# Patient Record
Sex: Male | Born: 1965 | Race: Black or African American | Hispanic: No | Marital: Single | Smoking: Current every day smoker
Health system: Southern US, Community
[De-identification: ages and names within clinical notes are randomized; demographics above are authoritative.]

## PROBLEM LIST (undated history)

## (undated) DIAGNOSIS — B1920 Unspecified viral hepatitis C without hepatic coma: Secondary | ICD-10-CM

## (undated) HISTORY — PX: FRACTURE SURGERY: SHX138

---

## 2008-07-13 ENCOUNTER — Emergency Department: Payer: Self-pay | Admitting: Internal Medicine

## 2019-01-13 ENCOUNTER — Ambulatory Visit (INDEPENDENT_AMBULATORY_CARE_PROVIDER_SITE_OTHER): Payer: Self-pay | Admitting: Gastroenterology

## 2019-01-13 ENCOUNTER — Other Ambulatory Visit: Admit: 2019-01-13 | Payer: Self-pay

## 2019-01-13 ENCOUNTER — Other Ambulatory Visit: Payer: Self-pay

## 2019-01-13 ENCOUNTER — Encounter: Payer: Self-pay | Admitting: Gastroenterology

## 2019-01-13 ENCOUNTER — Other Ambulatory Visit
Admission: RE | Admit: 2019-01-13 | Discharge: 2019-01-13 | Disposition: A | Discharge status: Home / Self Care | Payer: Self-pay | Attending: Gastroenterology | Admitting: Gastroenterology

## 2019-01-13 VITALS — BP 115/74 | HR 83 | Temp 99.4°F | Ht 70.0 in | Wt 214.0 lb

## 2019-01-13 DIAGNOSIS — B182 Chronic viral hepatitis C: Secondary | ICD-10-CM

## 2019-01-13 DIAGNOSIS — B1920 Unspecified viral hepatitis C without hepatic coma: Secondary | ICD-10-CM | POA: Insufficient documentation

## 2019-01-13 LAB — HEPATIC FUNCTION PANEL
ALT: 39 U/L (ref 0–44)
AST: 57 U/L — ABNORMAL HIGH (ref 15–41)
Albumin: 4 g/dL (ref 3.5–5.0)
Alkaline Phosphatase: 62 U/L (ref 38–126)
Bilirubin, Direct: 0.2 mg/dL (ref 0.0–0.2)
Indirect Bilirubin: 0.7 mg/dL (ref 0.3–0.9)
Total Bilirubin: 0.9 mg/dL (ref 0.3–1.2)
Total Protein: 7.6 g/dL (ref 6.5–8.1)

## 2019-01-13 LAB — IRON AND TIBC
Iron: 80 ug/dL (ref 45–182)
Saturation Ratios: 22 % (ref 17.9–39.5)
TIBC: 369 ug/dL (ref 250–450)
UIBC: 289 ug/dL

## 2019-01-13 LAB — FERRITIN: Ferritin: 51 ng/mL (ref 24–336)

## 2019-01-13 NOTE — Progress Notes (Signed)
Gastroenterology Consultation  Referring Provider:     Sofie Hartigan, MD Primary Care Physician:  Sofie Hartigan, MD Primary Gastroenterologist:  Dr. Allen Norris     Reason for Consultation:     Hepatitis C        HPI:   Ryan Petty is a 53 y.o. y/o male referred for consultation & management of Hepatitis C by Dr. Ellison Hughs, Chrissie Noa, MD.  This patient comes in today with a report of hepatitis C.  The patient reports that he was incarcerated at the age of 27 and spent his time [facility] up until about 18 months ago when he was discharged.  The patient reports that he was told some time ago that he had hepatitis B and reports that he was given some medication and a shot and was told he would be fine. The patient reports that he believes he may have gotten hepatitis C from jailhouse tattoos. The patient had blood work done in May that showed an isolated AST.  The patient Reports that he has a few beers a day.  There is no report of any black stools or bloody stools. The patient does report that he had a colonoscopy in 2018 while he was in jail.  He denies any IV drug use starting cocaine or blood transfusion before 1990.  History reviewed. No pertinent past medical history.  Past Surgical History:  Procedure Laterality Date  . FRACTURE SURGERY      Prior to Admission medications   Not on File    Family History  Problem Relation Age of Onset  . Throat cancer Mother   . Alcohol abuse Father   . Dementia Maternal Grandmother      Social History   Tobacco Use  . Smoking status: Current Every Day Smoker  . Smokeless tobacco: Never Used  Substance Use Topics  . Alcohol use: Yes    Comment: 2 to 4 times monthly   . Drug use: Not on file    Allergies as of 01/13/2019  . (No Known Allergies)    Review of Systems:    All systems reviewed and negative except where noted in HPI.   Physical Exam:  BP 115/74   Pulse 83   Temp 99.4 F (37.4 C) (Oral)   Ht 5\' 10"   (1.778 m)   Wt 214 lb (97.1 kg)   BMI 30.71 kg/m  No LMP for male patient. General:   Alert,  Well-developed, well-nourished, pleasant and cooperative in NAD Head:  Normocephalic and atraumatic. Eyes:  Sclera clear, no icterus.   Conjunctiva pink. Ears:  Normal auditory acuity. Neck:  Supple; no masses or thyromegaly. Lungs:  Respirations even and unlabored.  Clear throughout to auscultation.   No wheezes, crackles, or rhonchi. No acute distress. Heart:  Regular rate and rhythm; no murmurs, clicks, rubs, or gallops. Abdomen:  Normal bowel sounds.  No bruits.  Soft, non-tender and non-distended without masses, hepatosplenomegaly or hernias noted.  No guarding or rebound tenderness.  Negative Carnett sign.   Rectal:  Deferred.  Msk:  Symmetrical without gross deformities.  Good, equal movement & strength bilaterally. Pulses:  Normal pulses noted. Extremities:  No clubbing or edema.  No cyanosis. Neurologic:  Alert and oriented x3;  grossly normal neurologically. Skin:  Intact without significant lesions or rashes.  No jaundice. Lymph Nodes:  No significant cervical adenopathy. Psych:  Alert and cooperative. Normal mood and affect.  Imaging Studies: No results found.  Assessment  and Plan:   Freida BusmanMichael A Carignan is a 53 y.o. y/o male Who comes in today with a history of hepatitis C antibody being positive. The patient also had a viral load that showed 2.4 million international units per milliliter. The patient has never been treated for his hepatitis C. The patient will have labs sent off for other possible causes of abnormal liver enzymes and also for fibrosis score.  He will also be contacted with the results of the studies and these results will determine his treatment for his hepatitis C.  The patient has been explained the plan and agrees with it.  Midge Miniumarren Kemond Amorin, MD. Clementeen GrahamFACG    Note: This dictation was prepared with Dragon dictation along with smaller phrase technology. Any transcriptional  errors that result from this process are unintentional.

## 2019-01-14 LAB — ANA: Anti Nuclear Antibody (ANA): NEGATIVE

## 2019-01-14 LAB — MITOCHONDRIAL ANTIBODIES: Mitochondrial M2 Ab, IgG: <20 Units (ref 0.0–20.0)

## 2019-01-14 LAB — HEPATITIS B SURFACE ANTIGEN: Hepatitis B Surface Ag: NEGATIVE

## 2019-01-14 LAB — HCV RNA QUANT
HCV Quantitative Log: 6.439 log10 IU/mL (ref >1.70)
HCV Quantitative: 2750000 IU/mL (ref >50)

## 2019-01-14 LAB — HEPATITIS A ANTIBODY, TOTAL: hep A Total Ab: POSITIVE — AB

## 2019-01-14 LAB — ANTI-SMOOTH MUSCLE ANTIBODY, IGG: F-Actin IgG: 13 Units (ref 0–19)

## 2019-01-14 LAB — ALPHA-1 ANTITRYPSIN PHENOTYPE: A-1 Antitrypsin, Ser: 128 mg/dL (ref 101–187)

## 2019-01-14 LAB — HEPATITIS C ANTIBODY: HCV Ab: >11 s/co ratio — ABNORMAL HIGH (ref 0.0–0.9)

## 2019-01-15 LAB — HCV FIBROSURE
ALPHA 2-MACROGLOBULINS, QN: 269 mg/dL (ref 110–276)
ALT (SGPT) P5P: 44 IU/L (ref 0–55)
Apolipoprotein A-1: 114 mg/dL (ref 101–178)
Bilirubin, Total: 0.5 mg/dL (ref 0.0–1.2)
Fibrosis Score: 0.46 — ABNORMAL HIGH (ref 0.00–0.21)
GGT: 41 IU/L (ref 0–65)
Haptoglobin: 161 mg/dL (ref 29–370)
Necroinflammat Activity Score: 0.3 — ABNORMAL HIGH (ref 0.00–0.17)

## 2019-01-16 LAB — CERULOPLASMIN: Ceruloplasmin: 29.4 mg/dL (ref 16.0–31.0)

## 2019-01-27 ENCOUNTER — Other Ambulatory Visit: Payer: Self-pay

## 2019-01-27 ENCOUNTER — Telehealth: Payer: Self-pay

## 2019-01-27 DIAGNOSIS — B182 Chronic viral hepatitis C: Secondary | ICD-10-CM

## 2019-01-27 NOTE — Telephone Encounter (Signed)
Pt notified of lab results

## 2019-01-27 NOTE — Telephone Encounter (Signed)
-----   Message from Lucilla Lame, MD sent at 01/21/2019  6:29 AM EDT ----- This patient's labs were consistent with F1 fibrosis and chronic hepatitis C.  The patient is immune to hepatitis A but I do not see a hepatitis B surface antibody which should be checked and he should be vaccinated accordingly.  Otherwise he should be started on his treatment for hepatitis C.

## 2019-02-02 ENCOUNTER — Other Ambulatory Visit
Admission: RE | Admit: 2019-02-02 | Discharge: 2019-02-02 | Disposition: A | Discharge status: Home / Self Care | Payer: Self-pay | Attending: Gastroenterology | Admitting: Gastroenterology

## 2019-02-02 ENCOUNTER — Other Ambulatory Visit: Payer: Self-pay

## 2019-02-02 DIAGNOSIS — B182 Chronic viral hepatitis C: Secondary | ICD-10-CM | POA: Insufficient documentation

## 2019-02-03 LAB — HEPATITIS B SURFACE ANTIBODY,QUALITATIVE: Hep B S Ab: REACTIVE

## 2019-02-08 LAB — HEPATITIS C GENOTYPE

## 2019-02-15 ENCOUNTER — Telehealth: Payer: Self-pay

## 2019-02-15 NOTE — Telephone Encounter (Signed)
-----   Message from Lucilla Lame, MD sent at 02/08/2019  9:34 AM EDT ----- The patient does not need hep B vaccine.

## 2019-02-15 NOTE — Telephone Encounter (Signed)
Pt notified of lab results. Pt will come to the [facility] office tomorrow to sign the pt application for [place].

## 2019-02-15 NOTE — Telephone Encounter (Signed)
-----   Message from Lucilla Lame, MD sent at 02/08/2019  6:17 PM EDT ----- The patient has genotype 1a and should consider treatment for his hepatitis C.

## 2019-02-17 ENCOUNTER — Other Ambulatory Visit: Payer: Self-pay

## 2019-02-17 MED ORDER — MAVYRET 100-40 MG PO TABS: 3.0000 | ORAL_TABLET | Freq: Every day | ORAL | 1 refills | Status: DC

## 2019-02-21 ENCOUNTER — Other Ambulatory Visit: Payer: Self-pay

## 2019-02-21 MED ORDER — MAVYRET 100-40 MG PO TABS: 3.0000 | ORAL_TABLET | Freq: Every day | ORAL | 1 refills | Status: DC

## 2020-06-25 ENCOUNTER — Encounter: Payer: Self-pay | Admitting: Radiology

## 2020-06-25 ENCOUNTER — Emergency Department: Payer: Self-pay

## 2020-06-25 ENCOUNTER — Emergency Department
Admission: EM | Admit: 2020-06-25 | Discharge: 2020-06-25 | Disposition: A | Discharge status: Home / Self Care | Payer: Self-pay | Attending: Emergency Medicine | Admitting: Emergency Medicine

## 2020-06-25 ENCOUNTER — Other Ambulatory Visit: Payer: Self-pay

## 2020-06-25 DIAGNOSIS — F17200 Nicotine dependence, unspecified, uncomplicated: Secondary | ICD-10-CM | POA: Insufficient documentation

## 2020-06-25 DIAGNOSIS — R0789 Other chest pain: Secondary | ICD-10-CM | POA: Insufficient documentation

## 2020-06-25 DIAGNOSIS — R42 Dizziness and giddiness: Secondary | ICD-10-CM | POA: Insufficient documentation

## 2020-06-25 LAB — BASIC METABOLIC PANEL
Anion gap: 12 (ref 5–15)
BUN: 10 mg/dL (ref 6–20)
CO2: 21 mmol/L — ABNORMAL LOW (ref 22–32)
Calcium: 8.1 mg/dL — ABNORMAL LOW (ref 8.9–10.3)
Chloride: 102 mmol/L (ref 98–111)
Creatinine, Ser: 1.18 mg/dL (ref 0.61–1.24)
GFR, Estimated: >60 mL/min (ref >60)
Glucose, Bld: 195 mg/dL — ABNORMAL HIGH (ref 70–99)
Potassium: 3.9 mmol/L (ref 3.5–5.1)
Sodium: 135 mmol/L (ref 135–145)

## 2020-06-25 LAB — CBC
HCT: 41.7 % (ref 39.0–52.0)
Hemoglobin: 14.9 g/dL (ref 13.0–17.0)
MCH: 35.6 pg — ABNORMAL HIGH (ref 26.0–34.0)
MCHC: 35.7 g/dL (ref 30.0–36.0)
MCV: 99.8 fL (ref 80.0–100.0)
Platelets: 190 10*3/uL (ref 150–400)
RBC: 4.18 MIL/uL — ABNORMAL LOW (ref 4.22–5.81)
RDW: 14.5 % (ref 11.5–15.5)
WBC: 9.3 10*3/uL (ref 4.0–10.5)
nRBC: 0 % (ref 0.0–0.2)

## 2020-06-25 LAB — FIBRIN DERIVATIVES D-DIMER (ARMC ONLY): Fibrin derivatives D-dimer (ARMC): 742.93 ng/mL (FEU) — ABNORMAL HIGH (ref 0.00–499.00)

## 2020-06-25 LAB — TROPONIN I (HIGH SENSITIVITY)
Troponin I (High Sensitivity): 7 ng/L (ref <18)
Troponin I (High Sensitivity): 6 ng/L (ref <18)

## 2020-06-25 MED ORDER — LACTATED RINGERS IV BOLUS
1000.0000 mL | Freq: Once | INTRAVENOUS | Status: AC
  Administered 2020-06-25: 1000 mL via INTRAVENOUS

## 2020-06-25 MED ORDER — ASPIRIN 81 MG PO CHEW
324.0000 mg | CHEWABLE_TABLET | Freq: Once | ORAL | Status: AC
  Administered 2020-06-25: 324 mg via ORAL
  Filled 2020-06-25: qty 4

## 2020-06-25 MED ORDER — IOHEXOL 350 MG/ML SOLN
100.0000 mL | Freq: Once | INTRAVENOUS | Status: AC | PRN
  Administered 2020-06-25: 100 mL via INTRAVENOUS

## 2020-06-25 NOTE — Discharge Instructions (Addendum)

## 2020-06-25 NOTE — ED Notes (Signed)
Pt presents today with Chest pain that stared last night a 830pm. Pt endorsing lightheadedness and dizziness. Pt stable on monitor.

## 2020-06-25 NOTE — ED Triage Notes (Signed)
Pt states chest pain that started at 8:30 last night. Pt states he does get dizzy with it as well. Pt states pain gets worse with movement.

## 2020-06-25 NOTE — ED Provider Notes (Signed)
[facility] Emergency Department Provider Note  ____________________________________________  Time seen: Approximately 1:54 AM  I have reviewed the triage vital signs and the nursing notes.   HISTORY  Chief Complaint Chest Pain   HPI Ryan Petty is a 54 y.o. male with history of smoking hepatitis C who presents for evaluation of chest pain.  Patient reports the pain started at home before going to work this evening.  He describes the pain as a 3/10, dull, pressure located in the left side of his chest constant and nonradiating.  Has had intermittent episodes of lightheadedness associated with it where he also feels clammy.  No nausea or vomiting, no shortness of breath.  No personal history of heart attacks but his father has heart disease.  No personal or family history of blood clots, no recent travel immobilization, no leg pain or swelling, no hemoptysis or exogenous hormones.  The pain does not radiate to the back, no numbness or weakness of his extremities.  Patient denies ever having similar symptoms.  He reports he works at a [employer].  There is a lever that he needs to push and pull several times during the evening.  He noticed that the pain was markedly worse when doing that movements. He also reports that any movement of the torso makes the pain worse.  He denies carrying anything heavy recently.  He initially thought that the pain was just a muscle pull but he became more worried when he started having lightheaded episodes.   History reviewed. No pertinent past medical history.  Patient Active Problem List   Diagnosis Date Noted  . Hepatitis C without hepatic coma 01/13/2019    Past Surgical History:  Procedure Laterality Date  . FRACTURE SURGERY      Prior to Admission medications   Medication Sig Start Date End Date Taking? Authorizing Provider  Glecaprevir-Pibrentasvir (MAVYRET) 100-40 MG TABS Take 3 tablets by mouth  daily. 02/21/19   Midge Minium, MD    Allergies Patient has no known allergies.  Family History  Problem Relation Age of Onset  . Throat cancer Mother   . Alcohol abuse Father   . Dementia Maternal Grandmother     Social History Social History   Tobacco Use  . Smoking status: Current Every Day Smoker  . Smokeless tobacco: Never Used  Vaping Use  . Vaping Use: Never used  Substance Use Topics  . Alcohol use: Yes    Comment: 2 to 4 times monthly   . Drug use: Not on file    Review of Systems  Constitutional: Negative for fever. + Lightheadedness Eyes: Negative for visual changes. ENT: Negative for sore throat. Neck: No neck pain  Cardiovascular: + chest pain. Respiratory: Negative for shortness of breath. Gastrointestinal: Negative for abdominal pain, vomiting or diarrhea. Genitourinary: Negative for dysuria. Musculoskeletal: Negative for back pain. Skin: Negative for rash. Neurological: Negative for headaches, weakness or numbness. Psych: No SI or HI  ____________________________________________   PHYSICAL EXAM:  VITAL SIGNS: ED Triage Vitals  Enc Vitals Group     BP 06/25/20 0114 (!) 145/82     Pulse Rate 06/25/20 0114 (!) 101     Resp 06/25/20 0114 18     Temp 06/25/20 0114 98.7 F (37.1 C)     Temp src --      SpO2 06/25/20 0114 94 %     Weight 06/25/20 0115 210 lb (95.3 kg)     Height 06/25/20 0115 5\' 10"  (  1.778 m)     Head Circumference --      Peak Flow --      Pain Score 06/25/20 0114 5     Pain Loc --      Pain Edu? --      Excl. in GC? --     Constitutional: Alert and oriented. Well appearing and in no apparent distress. HEENT:      Head: Normocephalic and atraumatic.         Eyes: Conjunctivae are normal. Sclera is non-icteric.       Mouth/Throat: Mucous membranes are moist.       Neck: Supple with no signs of meningismus. Cardiovascular: Regular rate and rhythm. No murmurs, gallops, or rubs. 2+ symmetrical distal pulses are present in  all extremities. No JVD. Respiratory: Normal respiratory effort. Lungs are clear to auscultation bilaterally. No wheezes, crackles, or rhonchi.  Gastrointestinal: Soft, non tender. Musculoskeletal: Palpation of the left chest wall reproduces the pain. Nontender with normal range of motion in all extremities. No edema, cyanosis, or erythema of extremities. Neurologic: Normal speech and language. Face is symmetric. Moving all extremities. No gross focal neurologic deficits are appreciated. Skin: Skin is warm, dry and intact. No rash noted. Psychiatric: Mood and affect are normal. Speech and behavior are normal.  ____________________________________________   LABS (all labs ordered are listed, but only abnormal results are displayed)  Labs Reviewed  BASIC METABOLIC PANEL - Abnormal; Notable for the following components:      Result Value   CO2 21 (*)    Glucose, Bld 195 (*)    Calcium 8.1 (*)    All other components within normal limits  CBC - Abnormal; Notable for the following components:   RBC 4.18 (*)    MCH 35.6 (*)    All other components within normal limits  FIBRIN DERIVATIVES D-DIMER ([facility] ONLY) - Abnormal; Notable for the following components:   Fibrin derivatives D-dimer ([facility]) 742.93 (*)    All other components within normal limits  TROPONIN I (HIGH SENSITIVITY)  TROPONIN I (HIGH SENSITIVITY)   ____________________________________________  EKG  ED ECG REPORT I, Nita Sicklearolina Estell Dillinger, the attending physician, personally viewed and interpreted this ECG.  Normal sinus rhythm, rate of 96, normal intervals, normal axis, no ST elevations or depressions, T wave inversions in inferior leads.  No prior for comparison. ____________________________________________  RADIOLOGY  I have personally reviewed the images performed during this visit and I agree with the Radiologist's read.   Interpretation by Radiologist:  DG Chest 2 View  Result Date: 06/25/2020 CLINICAL DATA:   Chest pain EXAM: CHEST - 2 VIEW COMPARISON:  07/13/2008 FINDINGS: Lungs are well expanded, symmetric, and clear. No pneumothorax or pleural effusion. Cardiac size within normal limits. Pulmonary vascularity is normal. Osseous structures are age-appropriate. No acute bone abnormality. Healed left rib fracture noted IMPRESSION: No active cardiopulmonary disease. Electronically Signed   By: Helyn NumbersAshesh  Parikh MD   On: 06/25/2020 01:39   CT ANGIO CHEST AORTA W/CM & OR WO/CM  Result Date: 06/25/2020 CLINICAL DATA:  Chest pain, dizziness EXAM: CT ANGIOGRAPHY CHEST WITH CONTRAST TECHNIQUE: Multidetector CT imaging of the chest was performed using the standard protocol during bolus administration of intravenous contrast. Multiplanar CT image reconstructions and MIPs were obtained to evaluate the vascular anatomy. CONTRAST:  100mL OMNIPAQUE IOHEXOL 350 MG/ML SOLN COMPARISON:  None. FINDINGS: Cardiovascular: The thoracic aorta is normal in course and caliber. No intramural hematoma, aneurysm, or dissection. Minimal atherosclerotic calcification noted within the aortic arch.  There is adequate opacification of the pulmonary arterial tree. No intraluminal filling defects identified to suggest acute pulmonary embolism through the segmental level. The central pulmonary arteries are of normal caliber. Cardiac size is within normal limits. No significant coronary artery calcification. No pericardial effusion. Mediastinum/Nodes: The visualized thyroid is unremarkable. No pathologic thoracic adenopathy. The esophagus is unremarkable. Lungs/Pleura: Lungs are clear. No pleural effusion or pneumothorax. No central obstructing lesion. Upper Abdomen: Multiple simple cysts within the left hepatic lobe. Limited images of the upper abdomen are otherwise unremarkable. Musculoskeletal: Healed bilateral rib fractures are noted. No acute bone abnormality. No lytic or blastic bone lesion. Review of the MIP images confirms the above findings.  IMPRESSION: No evidence of aortic dissection or aneurysm. No acute thoracic pathology identified. Aortic Atherosclerosis (ICD10-I70.0). Electronically Signed   By: Helyn Numbers MD   On: 06/25/2020 04:01     ____________________________________________   PROCEDURES  Procedure(s) performed:yes .1-3 Lead EKG Interpretation Performed by: Nita Sickle, MD Authorized by: Nita Sickle, MD     Interpretation: non-specific     ECG rate assessment: normal     Rhythm: sinus rhythm     Ectopy: none     Critical Care performed: yes  CRITICAL CARE Performed by: Nita Sickle  ?  Total critical care time: 35 min  Critical care time was exclusive of separately billable procedures and treating other patients.  Critical care was necessary to treat or prevent imminent or life-threatening deterioration.  Critical care was time spent personally by me on the following activities: development of treatment plan with patient and/or surrogate as well as nursing, discussions with consultants, evaluation of patient's response to treatment, examination of patient, obtaining history from patient or surrogate, ordering and performing treatments and interventions, ordering and review of laboratory studies, ordering and review of radiographic studies, pulse oximetry and re-evaluation of patient's condition.  ____________________________________________   INITIAL IMPRESSION / ASSESSMENT AND PLAN / ED COURSE  54 y.o. male with history of smoking hepatitis C who presents for evaluation of chest pain.  Heart score of 4. Patient with constant pain for 5.5 hrs and has 2 EKGs here with no acute ischemic changes. First troponin negative. CXR visualized by me with no acute findings, confirmed by radiology.  Patient is otherwise well-appearing in no distress, strong equal pulses in all 4 extremities, heart regular rate and rhythm, no asymmetric leg swelling.   Ddx MSK especially with pain  reproducible on palpation and worse with movement of the pectoralis muscle, GERD, ACS, PE, dissection. No evidence of shingles.  Old medical records reviewed including patient's last annual exam from May 2020.  Patient placed on telemetry for close monitoring.  Will give a full aspirin and cycle cardiac enzymes.   _________________________ 4:11 AM on 06/25/2020 -----------------------------------------  2 EKGs with no acute ischemic changes. 2 high-sensitivity troponins negative. D-dimer was slightly elevated therefore patient was sent for CT angio of the chest which is negative for PE or dissection, visualized by me confirmed by radiology. At this time pain is most likely muscular skeletal however based on patient's age, comorbidities, history of smoking, family history of heart disease will refer patient to cardiology for further evaluation. Recommended follow-up with his PCP as well. Discussed my standard return precautions.      _____________________________________________ Please note:  Patient was evaluated in Emergency Department today for the symptoms described in the history of present illness. Patient was evaluated in the context of the global COVID-19 pandemic, which necessitated consideration that the patient  might be at risk for infection with the SARS-CoV-2 virus that causes COVID-19. Institutional protocols and algorithms that pertain to the evaluation of patients at risk for COVID-19 are in a state of rapid change based on information released by regulatory bodies including the CDC and federal and state organizations. These policies and algorithms were followed during the patient's care in the ED.  Some ED evaluations and interventions may be delayed as a result of limited staffing during the pandemic.   [place] Controlled Substance Database was reviewed by me. ____________________________________________   FINAL CLINICAL IMPRESSION(S) / ED DIAGNOSES   Final diagnoses:  Atypical  chest pain      NEW MEDICATIONS STARTED DURING THIS VISIT:  ED Discharge Orders    None       Note:  This document was prepared using Dragon voice recognition software and may include unintentional dictation errors.    Don Perking, [place], MD 06/25/20 [phone]

## 2020-12-06 ENCOUNTER — Encounter: Payer: Self-pay | Admitting: Emergency Medicine

## 2020-12-06 ENCOUNTER — Other Ambulatory Visit: Payer: Self-pay

## 2020-12-06 ENCOUNTER — Emergency Department
Admission: EM | Admit: 2020-12-06 | Discharge: 2020-12-06 | Disposition: A | Discharge status: Home / Self Care | Payer: BC Managed Care – PPO | Attending: Emergency Medicine | Admitting: Emergency Medicine

## 2020-12-06 DIAGNOSIS — K642 Third degree hemorrhoids: Secondary | ICD-10-CM | POA: Insufficient documentation

## 2020-12-06 DIAGNOSIS — K649 Unspecified hemorrhoids: Secondary | ICD-10-CM | POA: Diagnosis present

## 2020-12-06 DIAGNOSIS — F17200 Nicotine dependence, unspecified, uncomplicated: Secondary | ICD-10-CM | POA: Diagnosis not present

## 2020-12-06 MED ORDER — OXYMETAZOLINE HCL 0.05 % NA SOLN
1.0000 | Freq: Once | NASAL | Status: AC
  Administered 2020-12-06: 1 via NASAL
  Filled 2020-12-06: qty 30

## 2020-12-06 MED ORDER — HYDROCORT-PRAMOXINE (PERIANAL) 1-1 % EX FOAM
1.0000 | Freq: Two times a day (BID) | CUTANEOUS | Status: DC
  Administered 2020-12-06: 1 via RECTAL
  Filled 2020-12-06: qty 10

## 2020-12-06 MED ORDER — LIDOCAINE VISCOUS HCL 2 % MT SOLN
15.0000 mL | Freq: Once | OROMUCOSAL | Status: AC
  Administered 2020-12-06: 15 mL via OROMUCOSAL
  Filled 2020-12-06: qty 15

## 2020-12-06 NOTE — Discharge Instructions (Signed)
You may continue Preparation H for hemorrhoid.  You may also alternate Proctofoam for hemorrhoid.  Use wet wipes or sitz bath after bowel movements.  Take a daily stool softener to keep your stools soft.  Return to the ER for worsening symptoms, persistent vomiting, dizziness or other concerns.

## 2020-12-06 NOTE — ED Triage Notes (Signed)
Patient ambulatory to triage with steady gait, without difficulty or distress noted; pt reports bleeding hemorrhoids x 3 days; denies any pain or accomp symptoms

## 2020-12-06 NOTE — ED Provider Notes (Signed)
[facility] Emergency Department Provider Note   ____________________________________________   Event Date/Time   First MD Initiated Contact with Patient 12/06/20 0024     (approximate)  I have reviewed the triage vital signs and the nursing notes.   HISTORY  Chief Complaint Hemorrhoids    HPI Ryan Petty is a 55 y.o. male who presents to the ED from work with a chief complaint of bleeding hemorrhoid.  Patient reports history of hemorrhoids usually controlled by Preparation H.  Noted bleeding hemorrhoid x3 days.  Became scared when he wiped at work and toilet paper had bright red blood.  Denies chest pain, shortness of breath, abdominal pain, nausea, vomiting, dizziness/lightheadedness.  Does not take anticoagulants.  History of hepatitis C but denies bleeding issues such as bleeding gums while brushing teeth or unexplained ecchymosis.     Past medical history Hepatitis C  Patient Active Problem List   Diagnosis Date Noted  . Hepatitis C without hepatic coma 01/13/2019    Past Surgical History:  Procedure Laterality Date  . FRACTURE SURGERY      Prior to Admission medications   Medication Sig Start Date End Date Taking? Authorizing Provider  Glecaprevir-Pibrentasvir (MAVYRET) 100-40 MG TABS Take 3 tablets by mouth daily. 02/21/19   Midge Minium, MD    Allergies Patient has no known allergies.  Family History  Problem Relation Age of Onset  . Throat cancer Mother   . Alcohol abuse Father   . Dementia Maternal Grandmother     Social History Social History   Tobacco Use  . Smoking status: Current Every Day Smoker  . Smokeless tobacco: Never Used  Vaping Use  . Vaping Use: Never used  Substance Use Topics  . Alcohol use: Yes    Comment: "2beers/day"    Review of Systems  Constitutional: No fever/chills Eyes: No visual changes. ENT: No sore throat. Cardiovascular: Denies chest pain. Respiratory: Denies shortness of  breath. Gastrointestinal: No abdominal pain.  No nausea, no vomiting.  No diarrhea.  No constipation.  Positive for bleeding hemorrhoid. Genitourinary: Negative for dysuria. Musculoskeletal: Negative for back pain. Skin: Negative for rash. Neurological: Negative for headaches, focal weakness or numbness.   ____________________________________________   PHYSICAL EXAM:  VITAL SIGNS: ED Triage Vitals [12/06/20 0019]  Enc Vitals Group     BP 127/77     Pulse Rate 96     Resp 18     Temp 98 F (36.7 C)     Temp Source Oral     SpO2 97 %     Weight 210 lb (95.3 kg)     Height 5\' 10"  (1.778 m)     Head Circumference      Peak Flow      Pain Score 0     Pain Loc      Pain Edu?      Excl. in GC?     Constitutional: Alert and oriented. Well appearing and in no acute distress. Eyes: Conjunctivae are normal. PERRL. EOMI. Head: Atraumatic. Nose: No congestion/rhinnorhea. Mouth/Throat: Mucous membranes are moist.  Oropharynx non-erythematous. Neck: No stridor.   Cardiovascular: Normal rate, regular rhythm. Grossly normal heart sounds.  Good peripheral circulation. Respiratory: Normal respiratory effort.  No retractions. Lungs CTAB. Gastrointestinal: Soft and nontender to light or deep palpation. No distention. No abdominal bruits. No CVA tenderness. Rectal: Grade 3 external nonthrombosed hemorrhoid which is reducible with scant bleeding and overlying blood clot. Musculoskeletal: No lower extremity tenderness nor edema.  No  joint effusions. Neurologic:  Normal speech and language. No gross focal neurologic deficits are appreciated. No gait instability. Skin:  Skin is warm, dry and intact. No rash noted. Psychiatric: Mood and affect are normal. Speech and behavior are normal.  ____________________________________________   LABS (all labs ordered are listed, but only abnormal results are displayed)  Labs Reviewed - No data to  display ____________________________________________  EKG  None ____________________________________________  RADIOLOGY I, Kaeo Jacome J, personally viewed and evaluated these images (plain radiographs) as part of my medical decision making, as well as reviewing the written report by the radiologist.  ED MD interpretation: None  Official radiology report(s): No results found.  ____________________________________________   PROCEDURES  Procedure(s) performed (including Critical Care):  Procedures   ____________________________________________   INITIAL IMPRESSION / ASSESSMENT AND PLAN / ED COURSE  As part of my medical decision making, I reviewed the following data within the electronic MEDICAL RECORD NUMBER Nursing notes reviewed and incorporated, Old chart reviewed and Notes from prior ED visits     55 year old male presents with bleeding hemorrhoid.  Will apply lidocaine plus Afrin, Proctofoam.  Advised sitz baths and stool softener.  Strict return precautions given.  Patient verbalizes understanding and agrees with plan of care.      ____________________________________________   FINAL CLINICAL IMPRESSION(S) / ED DIAGNOSES  Final diagnoses:  Grade III hemorrhoids     ED Discharge Orders    None      *Please note:  Ryan Petty was evaluated in Emergency Department on 12/06/2020 for the symptoms described in the history of present illness. He was evaluated in the context of the global COVID-19 pandemic, which necessitated consideration that the patient might be at risk for infection with the SARS-CoV-2 virus that causes COVID-19. Institutional protocols and algorithms that pertain to the evaluation of patients at risk for COVID-19 are in a state of rapid change based on information released by regulatory bodies including the CDC and federal and state organizations. These policies and algorithms were followed during the patient's care in the ED.  Some ED  evaluations and interventions may be delayed as a result of limited staffing during and the pandemic.*   Note:  This document was prepared using Dragon voice recognition software and may include unintentional dictation errors.   Irean Hong, MD 12/06/20 [phone]

## 2021-11-11 DIAGNOSIS — Z20822 Contact with and (suspected) exposure to covid-19: Secondary | ICD-10-CM | POA: Diagnosis present

## 2021-11-11 DIAGNOSIS — J040 Acute laryngitis: Principal | ICD-10-CM | POA: Diagnosis present

## 2021-11-11 DIAGNOSIS — J0430 Supraglottitis, unspecified, without obstruction: Secondary | ICD-10-CM | POA: Diagnosis present

## 2021-11-11 DIAGNOSIS — F17210 Nicotine dependence, cigarettes, uncomplicated: Secondary | ICD-10-CM | POA: Diagnosis present

## 2021-11-11 DIAGNOSIS — B1920 Unspecified viral hepatitis C without hepatic coma: Secondary | ICD-10-CM | POA: Diagnosis present

## 2021-11-11 DIAGNOSIS — Z808 Family history of malignant neoplasm of other organs or systems: Secondary | ICD-10-CM

## 2021-11-11 DIAGNOSIS — Z811 Family history of alcohol abuse and dependence: Secondary | ICD-10-CM

## 2021-11-12 ENCOUNTER — Emergency Department: Payer: Self-pay

## 2021-11-12 ENCOUNTER — Other Ambulatory Visit: Payer: Self-pay

## 2021-11-12 ENCOUNTER — Inpatient Hospital Stay
Admission: EM | Admit: 2021-11-12 | Discharge: 2021-11-14 | DRG: 153 | Disposition: A | Discharge status: Home / Self Care | Payer: Self-pay | Attending: Internal Medicine | Admitting: Internal Medicine

## 2021-11-12 ENCOUNTER — Encounter: Payer: Self-pay | Admitting: *Deleted

## 2021-11-12 DIAGNOSIS — J040 Acute laryngitis: Secondary | ICD-10-CM | POA: Diagnosis present

## 2021-11-12 DIAGNOSIS — B1920 Unspecified viral hepatitis C without hepatic coma: Secondary | ICD-10-CM | POA: Diagnosis present

## 2021-11-12 DIAGNOSIS — J0430 Supraglottitis, unspecified, without obstruction: Principal | ICD-10-CM | POA: Diagnosis present

## 2021-11-12 DIAGNOSIS — F17200 Nicotine dependence, unspecified, uncomplicated: Secondary | ICD-10-CM | POA: Diagnosis present

## 2021-11-12 LAB — RESPIRATORY PANEL BY PCR

## 2021-11-12 LAB — CBC WITH DIFFERENTIAL/PLATELET
Abs Immature Granulocytes: 0.05 10*3/uL (ref 0.00–0.07)
Basophils Absolute: 0 10*3/uL (ref 0.0–0.1)
Basophils Relative: 0 %
Eosinophils Absolute: 0 10*3/uL (ref 0.0–0.5)
Eosinophils Relative: 0 %
HCT: 42.3 % (ref 39.0–52.0)
Hemoglobin: 14.8 g/dL (ref 13.0–17.0)
Immature Granulocytes: 1 %
Lymphocytes Relative: 8 %
Lymphs Abs: 0.8 10*3/uL (ref 0.7–4.0)
MCH: 35.5 pg — ABNORMAL HIGH (ref 26.0–34.0)
MCHC: 35 g/dL (ref 30.0–36.0)
MCV: 101.4 fL — ABNORMAL HIGH (ref 80.0–100.0)
Monocytes Absolute: 1.1 10*3/uL — ABNORMAL HIGH (ref 0.1–1.0)
Monocytes Relative: 12 %
Neutro Abs: 7.3 10*3/uL (ref 1.7–7.7)
Neutrophils Relative %: 79 %
Platelets: 228 10*3/uL (ref 150–400)
RBC: 4.17 MIL/uL — ABNORMAL LOW (ref 4.22–5.81)
RDW: 14.3 % (ref 11.5–15.5)
WBC: 9.3 10*3/uL (ref 4.0–10.5)
nRBC: 0 % (ref 0.0–0.2)

## 2021-11-12 LAB — BASIC METABOLIC PANEL
Anion gap: 10 (ref 5–15)
BUN: <5 mg/dL — ABNORMAL LOW (ref 6–20)
CO2: 25 mmol/L (ref 22–32)
Calcium: 8.9 mg/dL (ref 8.9–10.3)
Chloride: 102 mmol/L (ref 98–111)
Creatinine, Ser: 0.79 mg/dL (ref 0.61–1.24)
GFR, Estimated: >60 mL/min (ref >60)
Glucose, Bld: 118 mg/dL — ABNORMAL HIGH (ref 70–99)
Potassium: 3.8 mmol/L (ref 3.5–5.1)
Sodium: 137 mmol/L (ref 135–145)

## 2021-11-12 LAB — GROUP A STREP BY PCR: Group A Strep by PCR: NOT DETECTED

## 2021-11-12 LAB — RESP PANEL BY RT-PCR (FLU A&B, COVID) ARPGX2
Influenza A by PCR: NEGATIVE
Influenza B by PCR: NEGATIVE
SARS Coronavirus 2 by RT PCR: NEGATIVE

## 2021-11-12 LAB — GLUCOSE, CAPILLARY: Glucose-Capillary: 135 mg/dL — ABNORMAL HIGH (ref 70–99)

## 2021-11-12 LAB — MRSA NEXT GEN BY PCR, NASAL: MRSA by PCR Next Gen: NOT DETECTED

## 2021-11-12 LAB — TROPONIN I (HIGH SENSITIVITY): Troponin I (High Sensitivity): 14 ng/L (ref <18)

## 2021-11-12 MED ORDER — ONDANSETRON HCL 4 MG PO TABS: 4.0000 mg | ORAL_TABLET | Freq: Four times a day (QID) | ORAL | Status: DC | PRN

## 2021-11-12 MED ORDER — DEXAMETHASONE SODIUM PHOSPHATE 10 MG/ML IJ SOLN
10.0000 mg | Freq: Four times a day (QID) | INTRAMUSCULAR | Status: DC
  Administered 2021-11-12 – 2021-11-14 (×8): 10 mg via INTRAVENOUS
  Filled 2021-11-12 (×9): qty 1

## 2021-11-12 MED ORDER — ACETAMINOPHEN 325 MG PO TABS: 650.0000 mg | ORAL_TABLET | Freq: Four times a day (QID) | ORAL | Status: DC | PRN

## 2021-11-12 MED ORDER — LACTATED RINGERS IV SOLN: INTRAVENOUS | Status: DC

## 2021-11-12 MED ORDER — IOHEXOL 300 MG/ML  SOLN
75.0000 mL | Freq: Once | INTRAMUSCULAR | Status: AC | PRN
  Administered 2021-11-12: 75 mL via INTRAVENOUS

## 2021-11-12 MED ORDER — NICOTINE 21 MG/24HR TD PT24
21.0000 mg | MEDICATED_PATCH | Freq: Every day | TRANSDERMAL | Status: DC
  Administered 2021-11-12 – 2021-11-14 (×3): 21 mg via TRANSDERMAL
  Filled 2021-11-12 (×3): qty 1

## 2021-11-12 MED ORDER — ORAL CARE MOUTH RINSE
15.0000 mL | Freq: Two times a day (BID) | OROMUCOSAL | Status: DC
  Administered 2021-11-12 – 2021-11-14 (×4): 15 mL via OROMUCOSAL

## 2021-11-12 MED ORDER — IPRATROPIUM-ALBUTEROL 0.5-2.5 (3) MG/3ML IN SOLN
3.0000 mL | Freq: Once | RESPIRATORY_TRACT | Status: AC
  Administered 2021-11-12: 3 mL via RESPIRATORY_TRACT
  Filled 2021-11-12: qty 3

## 2021-11-12 MED ORDER — DEXAMETHASONE SODIUM PHOSPHATE 10 MG/ML IJ SOLN
10.0000 mg | Freq: Once | INTRAMUSCULAR | Status: AC
  Administered 2021-11-12: 10 mg via INTRAVENOUS
  Filled 2021-11-12: qty 1

## 2021-11-12 MED ORDER — ENOXAPARIN SODIUM 40 MG/0.4ML IJ SOSY
40.0000 mg | PREFILLED_SYRINGE | INTRAMUSCULAR | Status: DC
  Administered 2021-11-12 – 2021-11-13 (×2): 40 mg via SUBCUTANEOUS
  Filled 2021-11-12 (×2): qty 0.4

## 2021-11-12 MED ORDER — ACETAMINOPHEN 650 MG RE SUPP: 650.0000 mg | Freq: Four times a day (QID) | RECTAL | Status: DC | PRN

## 2021-11-12 MED ORDER — SODIUM CHLORIDE 0.9 % IV SOLN: INTRAVENOUS | Status: AC

## 2021-11-12 MED ORDER — CHLORHEXIDINE GLUCONATE CLOTH 2 % EX PADS
6.0000 | MEDICATED_PAD | Freq: Every day | CUTANEOUS | Status: DC
  Administered 2021-11-12 – 2021-11-14 (×3): 6 via TOPICAL

## 2021-11-12 MED ORDER — ONDANSETRON HCL 4 MG/2ML IJ SOLN: 4.0000 mg | Freq: Four times a day (QID) | INTRAMUSCULAR | Status: DC | PRN

## 2021-11-12 MED ORDER — SODIUM CHLORIDE 0.9 % IV SOLN
3.0000 g | Freq: Four times a day (QID) | INTRAVENOUS | Status: DC
  Administered 2021-11-12 – 2021-11-14 (×8): 3 g via INTRAVENOUS
  Filled 2021-11-12: qty 3
  Filled 2021-11-12: qty 8
  Filled 2021-11-12 (×2): qty 3
  Filled 2021-11-12 (×5): qty 8
  Filled 2021-11-12 (×2): qty 3

## 2021-11-12 MED ORDER — SODIUM CHLORIDE 0.9 % IV SOLN
3.0000 g | Freq: Once | INTRAVENOUS | Status: AC
  Administered 2021-11-12: 3 g via INTRAVENOUS
  Filled 2021-11-12: qty 8

## 2021-11-12 NOTE — Op Note (Signed)
11/12/2021 ? ?7:50 AM ? ? ? ?Sisto, Granillo ? ?[id number] ? ? ?Pre-Op Dx: Supraglottic swelling  ? ?Post-op Dx: Acute laryngitis and supraglottitis ? ?Proc: Flexible laryngoscopy ? ?Surg:  Cammy Copa ? ?Anes:  GOT ? ?EBL: None ? ?Comp: None ? ?Findings: Reddened inflamed tissues involving the laryngeal surface of the epiglottis, the false cords, some of the true cords, and the arytenoids and aryepiglottic folds.  No evidence of bacterial infection.  The subglottic space appears clear.  There is normal mobility of the cords and no lesions on the true cords.  The true cords and not very red or inflamed as much as the false cords.  He appears to be breathing easily and the cords work well.  There are no secretions pooling anywhere and he can manage these well. ? ?Procedure: The patient seen the bedside in the ER.  Nose a sprayed with 4% Xylocaine mixed with Afrin for vasoconstriction and slight numbing.  The flexible scope was lubricated and passed through his left nostril as this appears to be more open.  The nose and nasopharynx are relatively clear.  Hypopharynx shows the epiglottis to be elevated well see can see the laryngeal inlet easily.  The findings are as above with inflammation involving mostly the false cords and arytenoids and aryepiglottic folds.  The true cords are not very swollen but slightly reddened and the airway appears to be open and clear.  He is not having any shortness of breath currently.  There is no secretions anywhere in the hypopharynx and no sign of any lesions involving the larynx or hypopharynx. ? ?He tolerated the procedure well.  Scope was removed.  There were no operative complications. ? ?Dispo:   He is stable in the ER currently awaiting to be admitted ? ?Plan: He is to be admitted to the hospital for further observation ? ?Beverly Sessions Nassim Cosma ? ?11/12/2021 ?7:50 AM  ?

## 2021-11-12 NOTE — H&P (Signed)
?History and Physical  ? ? ?Patient: Ryan Petty GUR:[id number] DOB: Apr 16, 1966 ?DOA: 11/12/2021 ?DOS: the patient was seen and examined on 11/12/2021 ?PCP: Pcp, No  ?Patient coming from: Home ? ?Chief Complaint:  ?Chief Complaint  ?Patient presents with  ? Sore Throat  ? ?HPI: Ryan Petty is a 56 y.o. male with medical history significant for hepatitis C and nicotine dependence who presents to the ER for evaluation of hoarseness for about 2 weeks associated with cough that is nonproductive but worse on the day of admission.  Patient states that he had difficulty lying down because he could not breathe and felt like his throat was closing up on him so he called EMS.  He usually has coughing spells especially when laying down ?He has had pain in his throat for about 2 weeks and difficulty handling oral secretions when lying supine.  He has had chills but denies having any fever. ?He denies having any chest pain, no nausea, no vomiting, no headache, no abdominal pain, no changes in his bowel habits, no urinary symptoms, no focal deficit or blurred vision. ?Review of Systems: As mentioned in the history of present illness. All other systems reviewed and are negative. ?No past medical history on file. ?Past Surgical History:  ?Procedure Laterality Date  ? FRACTURE SURGERY    ? ?Social History:  reports that he has been smoking. He has never used smokeless tobacco. He reports current alcohol use. No history on file for drug use. ? ?No Known Allergies ? ?Family History  ?Problem Relation Age of Onset  ? Throat cancer Mother   ? Alcohol abuse Father   ? Dementia Maternal Grandmother   ? ? ?Prior to Admission medications   ?Medication Sig Start Date End Date Taking? Authorizing Provider  ?Phenylephrine-Pheniramine-DM (THERAFLU COLD & COUGH PO) Take 1 Package by mouth daily as needed.   Yes [provider]  ?Glecaprevir-Pibrentasvir (MAVYRET) 100-40 MG TABS Take 3 tablets by mouth daily. ?Patient not  taking: Reported on 11/12/2021 02/21/19   Midge Minium, MD  ? ? ?Physical Exam: ?Vitals:  ? 11/12/21 0245 11/12/21 0347 11/12/21 0630 11/12/21 0700  ?BP:  (!) 141/97 (!) 160/70 (!) 176/96  ?Pulse: 97 (!) 107 65 83  ?Resp: 14 20 17 14   ?Temp:      ?TempSrc:      ?SpO2: 96% 95% 94% 99%  ?Weight:      ?Height:      ? ?Physical Exam ?Vitals and nursing note reviewed.  ?Constitutional:   ?   Appearance: He is well-developed.  ?HENT:  ?   Head: Normocephalic and atraumatic.  ?   Mouth/Throat:  ?   Pharynx: Posterior oropharyngeal erythema present.  ?Eyes:  ?   Conjunctiva/sclera: Conjunctivae normal.  ?Cardiovascular:  ?   Rate and Rhythm: Normal rate and regular rhythm.  ?   Heart sounds: Normal heart sounds.  ?Pulmonary:  ?   Effort: Pulmonary effort is normal.  ?   Breath sounds: Normal breath sounds.  ?Abdominal:  ?   General: Bowel sounds are normal.  ?   Palpations: Abdomen is soft.  ?Musculoskeletal:  ?   Cervical back: Normal range of motion and neck supple.  ?Skin: ?   General: Skin is warm and dry.  ?Neurological:  ?   General: No focal deficit present.  ?   Mental Status: He is alert and oriented to person, place, and time.  ?Psychiatric:     ?   Mood and Affect: Mood  normal.     ?   Behavior: Behavior normal.  ? ? ?Data Reviewed: ?Relevant notes from primary care and specialist visits, past discharge summaries as available in EHR, including Care Everywhere. ?Prior diagnostic testing as pertinent to current admission diagnoses ?Updated medications and problem lists for reconciliation ?ED course, including vitals, labs, imaging, treatment and response to treatment ?Triage notes, nursing and pharmacy notes and ED provider's notes ?Notable results as noted in HPI ?Labs reviewed and essentially negative except for MCV 101.4 ?Respiratory viral panel is negative.  Group A strep by PCR is not detected ?Chest x-ray reviewed by me shows no evidence of acute cardiopulmonary disease ?CT soft tissue neck shows  Circumferential soft tissue thickening of the supraglottic larynx. ?This may indicate laryngitis or early acute supraglottitis. The transverse diameter of the laryngeal airway is narrowed to 3 mm. ?Twelve-lead EKG reviewed by me shows sinus arrhythmia ?There are no new results to review at this time. ? ?Assessment and Plan: ?* Laryngitis, acute ?Patient presents for evaluation of a 2-week history of hoarseness associated with a sore throat, nonproductive cough and difficulty breathing especially when lying down. ?He is able to control his secretions ?Soft tissue CT showed circumferential soft tissue thickening of the supraglottic larynx. This may indicate laryngitis or early acute supraglottitis. The transverse diameter of the laryngeal airway is narrowed to 3 mm. ?Patient has been seen and evaluated by ENT and had a flexible laryngoscopy which showed redness and inflammation in the laryngeal inlet but not as much on the true contrast of false cords.  The arytenoids and aryepiglottic folds are inflamed as well. ?No evidence of epiglottitis or acute bacterial infection. ?Continue Decadron 10 mg IV every 6 per ENT recommendation ?Continue Unasyn adjusted to renal function ?Patient had a clear liquid diet and advance as tolerated ? ?Supraglottitis ?Treatment as outlined in 1 ? ?Nicotine dependence ?Smoking cessation has been discussed with patient in detail ?We will place him on a nicotine transdermal patch 21 mg daily ? ? ? ? ? Advance Care Planning:   Code Status: Full Code  ? ?Consults: ENT ? ?Family Communication: Greater than 50% of time was spent discussing patient's condition and plan of care with him at the bedside.  All questions and concerns have been addressed.  He verbalizes understanding and agrees with the plan. ? ?Severity of Illness: ?The appropriate patient status for this patient is INPATIENT. Inpatient status is judged to be reasonable and necessary in order to provide the required intensity of  service to ensure the patient's safety. The patient's presenting symptoms, physical exam findings, and initial radiographic and laboratory data in the context of their chronic comorbidities is felt to place them at high risk for further clinical deterioration. Furthermore, it is not anticipated that the patient will be medically stable for discharge from the hospital within 2 midnights of admission.  ? ?* I certify that at the point of admission it is my clinical judgment that the patient will require inpatient hospital care spanning beyond 2 midnights from the point of admission due to high intensity of service, high risk for further deterioration and high frequency of surveillance required.* ? ?Author: ?Lucile Shutters, MD ?11/12/2021 9:52 AM ? ?For on call review [email or web address].  ?

## 2021-11-12 NOTE — ED Triage Notes (Signed)
Pt reports losing voice approx 2 weeks ago.  Pt reports tonight throat feels like its closing up and having difficulty lying down because of throat secretions.  Cig smoker.  Pt alert ?

## 2021-11-12 NOTE — Consult Note (Signed)
Ryan Petty, Ryan Petty ?[id number] ?[birth year] ?Lucile Shutters, MD ? ?Reason for Consult: Evaluate for supraglottic swelling ? ?HPI: The patient is a 56 year old African-American male with a history of hepatitis C who presents for evaluation of sore throat.  He has had some soreness of his throat and dry cough for about 2 weeks that has been worsening.  Over the last couple of days became hoarse and last night started feeling short of breath mostly when he laid back.  He is been having some coughing fits.  He denies any chest pain he has not had any fever.  He is not coughing out any purulent sputum.  He has not had vomiting or diarrhea. ? ?He is a smoker every day and has been smoking marijuana as well.  He admits that a couple of days ago he was smoking some cocaine as well that was a friend's. ? ?Allergies: No Known Allergies ? ?ROS: Review of systems normal other than 12 systems except per HPI. ? ?PMH: No past medical history on file. ? ?FH:  ?Family History  ?Problem Relation Age of Onset  ? Throat cancer Mother   ? Alcohol abuse Father   ? Dementia Maternal Grandmother   ? ? ?SH:  ?Social History  ? ?Socioeconomic History  ? Marital status: Single  ?  Spouse name: Not on file  ? Number of children: Not on file  ? Years of education: Not on file  ? Highest education level: Not on file  ?Occupational History  ? Not on file  ?Tobacco Use  ? Smoking status: Every Day  ? Smokeless tobacco: Never  ?Vaping Use  ? Vaping Use: Never used  ?Substance and Sexual Activity  ? Alcohol use: Yes  ?  Comment: "2beers/day"  ? Drug use: Not on file  ? Sexual activity: Not on file  ?Other Topics Concern  ? Not on file  ?Social History Narrative  ? Not on file  ? ?Social Determinants of Health  ? ?Financial Resource Strain: Not on file  ?Food Insecurity: Not on file  ?Transportation Needs: Not on file  ?Physical Activity: Not on file  ?Stress: Not on file  ?Social Connections: Not on file  ?Intimate Partner Violence: Not on file   ? ? ?PSH:  ?Past Surgical History:  ?Procedure Laterality Date  ? FRACTURE SURGERY    ? ? ?Physical  Exam: The patient is awake and alert very cooperative.  His voice is a little bit hoarse.  He is not short of breath currently and no signs of stridor or wheeze.  ?The oropharynx shows no signs of redness or swelling at all in the back of his throat and his posterior pharynx looks clear.  His nose is open anteriorly with no sign of any congestion.  Neck supple with no masses or lesions. No lymphadenopathy palpated. Thyroid normal with no masses. ? ?A flexible laryngoscopy was done and is dictated in detail elsewhere.  This showed redness and inflammation in the laryngeal inlet but not as much on the true cords as the false cords.  The arytenoids and aryepiglottic folds are inflamed as well.  He does not have epiglottitis and there is no evidence of acute bacterial infection. ? ? ?A/P: The patient has supraglottitis that involves swelling of the false cords, arytenoids, aryepiglottic folds and the laryngeal surface of the epiglottis.  His true cords are little reddened but not swollen and his airway appears to be open and clear at this time.  This could represent some viral  inflammation here but I suspect that there may be some chemical irritation as well from what ever he was smoking causing inflammation at the laryngeal inlet.  His swelling seems to be improving some with the Decadron such that he is having less problems with his breathing, although he still having discomfort from the mucosal irritation.  I think he should be observed for the next 24 hours least to make sure the swelling continues to settle down and then potentially go home on a steroid taper.  Right now I do not see any obvious signs of bacterial infection and not sure if he needs significant antibiotic coverage for very long.  I stressed the importance of stopping smoking and not smoking any other substances that may be causing acute irritation  and inflammation of his laryngeal inlet as this can cause more swelling and can be life-threatening.  He does not need a specific follow-up in the office unless he continues to have problems, and we can reevaluate his larynx at that time ? ? ?Beverly Sessions Manessa Buley ?11/12/2021 ?7:55 AM ? ?  ?

## 2021-11-12 NOTE — ED Provider Notes (Signed)
? ?[facility] ?Provider Note ? ? ? Event Date/Time  ? First MD Initiated Contact with Patient 11/12/21 0054   ?  (approximate) ? ? ?History  ? ?Sore Throat ? ? ?HPI ? ?Ryan Petty is a 56 y.o. male with a history of hepatitis C who presents for evaluation of sore throat.  Patient reports a sore throat and a dry cough for about 2 weeks.  Over the last several days has become hoarse and tonight started feeling short of breath mostly when he lays back.  He reports that he has been having coughing fits and it is hard for him to breathe.  He denies any chest pain or fever.  He denies any vomiting or diarrhea. ?  ? ? ?No past medical history on file. ? ?Past Surgical History:  ?Procedure Laterality Date  ? FRACTURE SURGERY    ? ? ? ?Physical Exam  ? ?Triage Vital Signs: ?ED Triage Vitals  ?Enc Vitals Group  ?   BP 11/12/21 0004 (!) 177/133  ?   Pulse Rate 11/12/21 0004 99  ?   Resp 11/12/21 0004 20  ?   Temp 11/12/21 0004 98.8 ?F (37.1 ?C)  ?   Temp Source 11/12/21 0004 Oral  ?   SpO2 11/12/21 0004 96 %  ?   Weight 11/12/21 0005 195 lb (88.5 kg)  ?   Height 11/12/21 0005 5\' 10"  (1.778 m)  ?   Head Circumference --   ?   Peak Flow --   ?   Pain Score 11/12/21 0005 4  ?   Pain Loc --   ?   Pain Edu? --   ?   Excl. in GC? --   ? ? ?Most recent vital signs: ?Vitals:  ? 11/12/21 0200 11/12/21 0245  ?BP:    ?Pulse: 77 97  ?Resp: (!) 21 14  ?Temp:    ?SpO2: 99% 96%  ? ? ? ?Constitutional: Alert and oriented. Well appearing and in no apparent distress. Hoarse ?HEENT: ?     Head: Normocephalic and atraumatic.    ?     Eyes: Conjunctivae are normal. Sclera is non-icteric.  ?     Mouth/Throat: Mucous membranes are moist.  Oropharynx is clear with mild erythema, no tonsillar hypertrophy, no exudates, no peritonsillar abscess, no mass.  Uvula and tongue are normal.  Floor of the mouth is soft with no induration or tenderness.  There is no trismus ?     Neck: Supple with no signs of  meningismus. ?Cardiovascular: Regular rate and rhythm. No murmurs, gallops, or rubs. 2+ symmetrical distal pulses are present in all extremities.  ?Respiratory: Normal respiratory effort. Lungs are clear to auscultation bilaterally.  ?Gastrointestinal: Soft, non tender, and non distended with positive bowel sounds. No rebound or guarding. ?Genitourinary: No CVA tenderness. ?Musculoskeletal:  No edema, cyanosis, or erythema of extremities. ?Neurologic: Normal speech and language. Face is symmetric. Moving all extremities. No gross focal neurologic deficits are appreciated. ?Skin: Skin is warm, dry and intact. No rash noted. ?Psychiatric: Mood and affect are normal. Speech and behavior are normal. ? ?ED Results / Procedures / Treatments  ? ?Labs ?(all labs ordered are listed, but only abnormal results are displayed) ?Labs Reviewed  ?CBC WITH DIFFERENTIAL/PLATELET - Abnormal; Notable for the following components:  ?    Result Value  ? RBC 4.17 (*)   ? MCV 101.4 (*)   ? MCH 35.5 (*)   ? Monocytes Absolute 1.1 (*)   ?  All other components within normal limits  ?BASIC METABOLIC PANEL - Abnormal; Notable for the following components:  ? Glucose, Bld 118 (*)   ? BUN <5 (*)   ? All other components within normal limits  ?GROUP A STREP BY PCR  ?RESP PANEL BY RT-PCR (FLU A&B, COVID) [id number]  ?TROPONIN I (HIGH SENSITIVITY)  ? ? ? ?EKG ? ?ED ECG REPORT ?I, Nita Sickle, the attending physician, personally viewed and interpreted this ECG. ? ?Sinus rhythm with a rate of 85, normal intervals, normal axis, no ST elevations or depressions ? ?RADIOLOGY ?I, Nita Sickle, attending MD, have personally viewed and interpreted the images obtained during this visit as below: ? ?Chest x-ray negative for pneumonia ? ?CT showing supraglottic swelling ? ?___________________________________________________ ?Interpretation by Radiologist:  ?CT Soft Tissue Neck W Contrast ? ?Result Date: 11/12/2021 ?CLINICAL DATA:  Epiglottitis or  tonsillitis suspected. Pt reports losing voice approx 2 weeks ago. Pt reports tonight throat feels like its closing up and having difficulty lying down because of throat secretions. EXAM: CT NECK WITH CONTRAST TECHNIQUE: Multidetector CT imaging of the neck was performed using the standard protocol following the bolus administration of intravenous contrast. RADIATION DOSE REDUCTION: This exam was performed according to the departmental dose-optimization program which includes automated exposure control, adjustment of the mA and/or kV according to patient size and/or use of iterative reconstruction technique. CONTRAST:  1mL OMNIPAQUE IOHEXOL 300 MG/ML  SOLN COMPARISON:  None. FINDINGS: PHARYNX AND LARYNX: The nasopharynx, oropharynx and larynx are normal. Visible portions of the oral cavity, tongue base and floor of mouth are normal. Normal epiglottis, vallecula and pyriform sinuses. There is circumferential soft tissue thickening of the supraglottic larynx. The transverse diameter of the laryngeal airway is narrowed to 3 mm. No retropharyngeal abscess, effusion or lymphadenopathy. SALIVARY GLANDS: Normal parotid, submandibular and sublingual glands. THYROID: Normal. LYMPH NODES: No enlarged or abnormal density lymph nodes. VASCULAR: Major cervical vessels are patent. LIMITED INTRACRANIAL: Normal. VISUALIZED ORBITS: Normal. MASTOIDS AND VISUALIZED PARANASAL SINUSES: No fluid levels or advanced mucosal thickening. No mastoid effusion. SKELETON: No bony spinal canal stenosis. No lytic or blastic lesions. UPPER CHEST: Clear. OTHER: None. IMPRESSION: Circumferential soft tissue thickening of the supraglottic larynx. This may indicate laryngitis or early acute supraglottitis. The transverse diameter of the laryngeal airway is narrowed to 3 mm. These results were called by telephone at the time of interpretation on 11/12/2021 at 3:23 am to provider [facility] , who verbally acknowledged these results. Electronically  Signed   By: Deatra Robinson M.D.   On: 11/12/2021 03:23  ? ?DG Chest Portable 1 View ? ?Result Date: 11/12/2021 ?CLINICAL DATA:  Cough and shortness of breath EXAM: PORTABLE CHEST 1 VIEW COMPARISON:  06/25/2020 FINDINGS: The heart size and mediastinal contours are within normal limits. Both lungs are clear. The visualized skeletal structures are unremarkable. IMPRESSION: No active disease. Electronically Signed   By: Deatra Robinson M.D.   On: 11/12/2021 01:21   ? ? ? ? ?PROCEDURES: ? ?Critical Care performed: Yes, see critical care procedure note(s) ? ?.Critical Care ?Performed by: Nita Sickle, MD ?Authorized by: Nita Sickle, MD  ? ?Critical care provider statement:  ?  Critical care time (minutes):  30 ?  Critical care was necessary to treat or prevent imminent or life-threatening deterioration of the following conditions:  Respiratory failure and shock ?  Critical care was time spent personally by me on the following activities:  Development of treatment plan with patient or surrogate, discussions with consultants, evaluation  of patient's response to treatment, examination of patient, ordering and review of laboratory studies, ordering and review of radiographic studies, ordering and performing treatments and interventions, pulse oximetry, re-evaluation of patient's condition and review of old charts ?  I assumed direction of critical care for this patient from another provider in my specialty: no   ?  Care discussed with: admitting provider   ? ? ? ?IMPRESSION / MDM / ASSESSMENT AND PLAN / ED COURSE  ?I reviewed the triage vital signs and the nursing notes. ? ?56 y.o. male with a history of hepatitis C who presents for evaluation of sore throat, dry cough, hoarseness, and now SOB.  Patient is hoarse but in no respiratory distress, no stridor, no hypoxia, normal work of breathing and normal sats.  He does have some faint expiratory wheezes bilaterally.  He denies history of COPD but he is a chronic  smoker.  No fever, no tachycardia, no hypoxia.  Oropharynx is clear with no exudates, no tonsillar hypertrophy, no peritonsillar abscess, no signs of Ludewig's angina. ? ?Ddx: Viral syndrome versus strep throat versus retropharyngeal a

## 2021-11-12 NOTE — ED Notes (Signed)
Pt found to have dragged bed to BR, unhooked from monitors, and cleaning himself up from bowel incontinence. Pt cleaned up, given gown to change into. Bed moved back to center of room. Linens changed. Chux pad placed on bed. Urinal within reach. Call light within reach. Pt provided with ginger ale per request. No further needs expressed at this time.  ?

## 2021-11-12 NOTE — ED Notes (Signed)
Lab called to add on respiratory panel.  

## 2021-11-12 NOTE — ED Notes (Signed)
Pt to triage for covid swab as ordered; noted stridor with inspiration and voice hoarseness; pt c/o throat pain and sensation of diff handling oral secretions when lying supine; noted uvula swollen; acuity level changed ?

## 2021-11-12 NOTE — Progress Notes (Signed)
Pharmacy Antibiotic Note ? ?Ryan Petty is a 56 y.o. male admitted on 11/12/2021 with epiglottitis.  Pharmacy has been consulted for Unasyn dosing. ? ?Plan: ?Unasyn 3 gm q6h x 7 days per indication & renal fxn.  Pharmacy will continue to follow and will adjust and/or extend dosing as warranted. ? ?Height: 5\' 10"  (177.8 cm) ?Weight: 88.5 kg (195 lb) ?IBW/kg (Calculated) : 73 ? ?Temp (24hrs), Avg:98.8 ?F (37.1 ?C), Min:98.8 ?F (37.1 ?C), Max:98.8 ?F (37.1 ?C) ? ?Recent Labs  ?Lab 11/12/21 ?0132  ?WBC 9.3  ?CREATININE 0.79  ?  ?Estimated Creatinine Clearance: 114.1 mL/min (by C-G formula based on SCr of 0.79 mg/dL).   ? ?No Known Allergies ? ?Antimicrobials this admission: ?4/18 Unasyn >> x 7 days ? ?Microbiology results: ?No lab cx ordered or pending at this time. ? ?Thank you for allowing pharmacy to be a part of this patient?s care. ? ?Renda Rolls, PharmD, MBA ?11/12/2021 ?5:15 AM ? ? ?

## 2021-11-12 NOTE — Plan of Care (Signed)
?  Problem: Education: Goal: Knowledge of General Education information will improve Description: Including pain rating scale, medication(s)/side effects and non-pharmacologic comfort measures Outcome: Progressing   Problem: Clinical Measurements: Goal: Will remain free from infection Outcome: Progressing Goal: Diagnostic test results will improve Outcome: Progressing   Problem: Nutrition: Goal: Adequate nutrition will be maintained Outcome: Progressing   Problem: Coping: Goal: Level of anxiety will decrease Outcome: Progressing   Problem: Pain Managment: Goal: General experience of comfort will improve Outcome: Progressing   Problem: Safety: Goal: Ability to remain free from injury will improve Outcome: Progressing   

## 2021-11-12 NOTE — Assessment & Plan Note (Signed)
Treatment as outlined in 1 

## 2021-11-12 NOTE — Assessment & Plan Note (Addendum)
Patient presents for evaluation of a 2-week history of hoarseness associated with a sore throat, nonproductive cough and difficulty breathing especially when lying down. ?He is able to control his secretions ?Soft tissue CT showed circumferential soft tissue thickening of the supraglottic larynx. This may indicate laryngitis or early acute supraglottitis. The transverse diameter of the laryngeal airway is narrowed to 3 mm. ?Patient has been seen and evaluated by ENT and had a flexible laryngoscopy which showed redness and inflammation in the laryngeal inlet but not as much on the true contrast of false cords.  The arytenoids and aryepiglottic folds are inflamed as well. ?No evidence of epiglottitis or acute bacterial infection. ?--ENT consulted ?--Treated with Decadron 10 mg IV q6h ?--Treated with Unasyn >> d/c on Augmentin ?--Tolerating soft today and PO hydration ? ? ?

## 2021-11-12 NOTE — Assessment & Plan Note (Addendum)
Smoking cessation has been discussed. ?Nicotine patch ordered. ?

## 2021-11-13 LAB — BASIC METABOLIC PANEL
Anion gap: 6 (ref 5–15)
BUN: 10 mg/dL (ref 6–20)
CO2: 26 mmol/L (ref 22–32)
Calcium: 8.6 mg/dL — ABNORMAL LOW (ref 8.9–10.3)
Chloride: 105 mmol/L (ref 98–111)
Creatinine, Ser: 0.85 mg/dL (ref 0.61–1.24)
GFR, Estimated: >60 mL/min (ref >60)
Glucose, Bld: 138 mg/dL — ABNORMAL HIGH (ref 70–99)
Potassium: 3.4 mmol/L — ABNORMAL LOW (ref 3.5–5.1)
Sodium: 137 mmol/L (ref 135–145)

## 2021-11-13 LAB — HIV ANTIBODY (ROUTINE TESTING W REFLEX): HIV Screen 4th Generation wRfx: NONREACTIVE

## 2021-11-13 LAB — CBC
HCT: 38.2 % — ABNORMAL LOW (ref 39.0–52.0)
Hemoglobin: 13.3 g/dL (ref 13.0–17.0)
MCH: 35.8 pg — ABNORMAL HIGH (ref 26.0–34.0)
MCHC: 34.8 g/dL (ref 30.0–36.0)
MCV: 102.7 fL — ABNORMAL HIGH (ref 80.0–100.0)
Platelets: 225 10*3/uL (ref 150–400)
RBC: 3.72 MIL/uL — ABNORMAL LOW (ref 4.22–5.81)
RDW: 14.4 % (ref 11.5–15.5)
WBC: 12.9 10*3/uL — ABNORMAL HIGH (ref 4.0–10.5)
nRBC: 0 % (ref 0.0–0.2)

## 2021-11-13 MED ORDER — POTASSIUM CHLORIDE CRYS ER 20 MEQ PO TBCR: 20.0000 meq | EXTENDED_RELEASE_TABLET | Freq: Two times a day (BID) | ORAL | Status: DC

## 2021-11-13 MED ORDER — POTASSIUM CHLORIDE 20 MEQ PO PACK
40.0000 meq | PACK | Freq: Once | ORAL | Status: AC
  Administered 2021-11-13: 40 meq via ORAL
  Filled 2021-11-13: qty 2

## 2021-11-13 NOTE — Assessment & Plan Note (Addendum)
Followed by Dr. Servando Snare in 2020. ?Started on Mavyret, but was cost prohibitive at the time.  Per pharmacy, pt now has medicaid, likely to be covered.   ?--GI follow up ?--Mavyret Rx sent ?--Viral count pending ?

## 2021-11-13 NOTE — Progress Notes (Signed)
PHARMACY CONSULT NOTE - FOLLOW UP ? ?Pharmacy Consult for Electrolyte Monitoring and Replacement  ? ?Recent Labs: ?Potassium (mmol/L)  ?Date Value  ?11/13/2021 3.4 (L)  ? ?Calcium (mg/dL)  ?Date Value  ?11/13/2021 8.6 (L)  ? ?Albumin (g/dL)  ?Date Value  ?01/13/2019 4.0  ? ?Sodium (mmol/L)  ?Date Value  ?11/13/2021 137  ? ? ?Assessment: ?56 year old male presenting with hoarseness. Admitted with laryngitis. PMH hepatitis C.  ? ?Diet: thin liquids ? ?Goal of Therapy:  ?Electrolytes within normal limits ? ?Plan:  ?Give Kcl PO 40 meq packet x 1 ?Follow up electrolytes in AM ? ?Jaynie Bream, PharmD ?Pharmacy Resident  ?11/13/2021 ?1:26 PM ? ?

## 2021-11-13 NOTE — Progress Notes (Signed)
?Progress Note ? ? ?Patient: Ryan Petty [id number] DOB: 1966/05/07 DOA: 11/12/2021     1 ?DOS: the patient was seen and examined on 11/13/2021 ?  ?Brief hospital course: ?HPI on admission: Ryan Petty is a 56 y.o. male with medical history significant for hepatitis C and nicotine dependence who presents to the ER for evaluation of hoarseness and sore throat for about 2 weeks associated with worsening nonproductive cough.  Patient states that he had difficulty lying down because he could not breathe and felt like his throat was closing up on him so he called EMS.  ? ?CT neck soft tissue in the ED showed circumferential soft tissue thickening of the supraglottic larynx. This may indicate laryngitis or early acute supraglottitis. The transverse diameter of the laryngeal airway is narrowed to 3 mm. ? ?Seen by ENT and had a flexible laryngoscopy which showed redness and inflammation in the laryngeal inlet but not as much on the true contrast of false cords.  The arytenoids and aryepiglottic folds are inflamed as well. ?No evidence of epiglottitis or acute bacterial infection. ? ?Started on IV Unasyn and IV Decadron and admitted to stepdown unit for close monitoring.   ? ?Assessment and Plan: ?* Laryngitis, acute ?Patient presents for evaluation of a 2-week history of hoarseness associated with a sore throat, nonproductive cough and difficulty breathing especially when lying down. ?He is able to control his secretions ?Soft tissue CT showed circumferential soft tissue thickening of the supraglottic larynx. This may indicate laryngitis or early acute supraglottitis. The transverse diameter of the laryngeal airway is narrowed to 3 mm. ?Patient has been seen and evaluated by ENT and had a flexible laryngoscopy which showed redness and inflammation in the laryngeal inlet but not as much on the true contrast of false cords.  The arytenoids and aryepiglottic folds are inflamed as well. ?No evidence of epiglottitis  or acute bacterial infection. ?--ENT consulted ?--Continue Decadron 10 mg IV q6h ?--Continue Unasyn ?--Tolerating \\clear  liquid diet, advance to soft today ?--Respiratory status stable for transfer out of ICU ?--Hopeful to d/c on oral antibiotic and steroid taper in 24-48 hours if further improvement ? ?Supraglottitis ?Treatment as outlined in 1 ? ?Nicotine dependence ?Smoking cessation has been discussed. ?Nicotine patch ordered. ? ? ? ? ?  ? ?Subjective: Pt seen in stepdown this AM, sitting up in bed. Reports sore throat and swelling improving.  Tolerated clear liquids well and wants to try food today.  No fever/chills.  No other acute complaints.  ? ?Physical Exam: ?Vitals:  ? 11/13/21 1300 11/13/21 1400 11/13/21 1500 11/13/21 1811  ?BP: 128/76 (!) 137/106 101/80 (!) 145/75  ?Pulse: 85 87 84 84  ?Resp: (!) 31 (!) 26 (!) 30 18  ?Temp:    99.5 ?F (37.5 ?C)  ?TempSrc:      ?SpO2: 98% 97% 100% 98%  ?Weight:      ?Height:      ? ?General exam: awake, alert, no acute distress ?HEENT: submandibular and anterior cervical lymphadenopathy but nodes are non-tender on palpation, moist mucus membranes, hearing grossly normal  ?Respiratory system: CTAB, no wheezes, rales or rhonchi, normal respiratory effort. ?Cardiovascular system: normal S1/S2,  RRR, no JVD, murmurs, rubs, gallops,  no pedal edema.   ?Gastrointestinal system: soft, NT, ND, no HSM felt, +bowel sounds. ?Central nervous system: A&O x3. no gross focal neurologic deficits, normal speech ?Extremities: moves all , no edema, normal tone ?Skin: dry, intact, normal temperature ?Psychiatry: normal mood, congruent affect, judgement and insight appear  normal ? ? ?Data Reviewed: ? ?Notable labs: K 3.4, glucose 138, Ca 8.6, WBC 12.9k, MRSA PCR screen negative.  HIV non-reactive.  ? ?Family Communication: none at bedside on rounds, pt declines need to update, able to do so. ? ?Disposition: ?Status is: Inpatient ?Remains inpatient appropriate because: requires further close  monitoring while diet is advanced. If further improvement in swelling, likely d/c tomorrow.  ? ? Planned Discharge Destination: Home ? ? ? ?Time spent: 35 minutes ? ?Author: ?Ezekiel Slocumb, DO ?11/13/2021 6:39 PM ? ?For on call review [email or web address].  ?

## 2021-11-13 NOTE — Hospital Course (Signed)
HPI on admission: Ryan Petty is a 56 y.o. male with medical history significant for hepatitis C and nicotine dependence who presents to the ER for evaluation of hoarseness and sore throat for about 2 weeks associated with worsening nonproductive cough.  Patient states that he had difficulty lying down because he could not breathe and felt like his throat was closing up on him so he called EMS.  ? ?CT neck soft tissue in the ED showed circumferential soft tissue thickening of the supraglottic larynx. This may indicate laryngitis or early acute supraglottitis. The transverse diameter of the laryngeal airway is narrowed to 3 mm. ? ?Seen by ENT and had a flexible laryngoscopy which showed redness and inflammation in the laryngeal inlet but not as much on the true contrast of false cords.  The arytenoids and aryepiglottic folds are inflamed as well. ?No evidence of epiglottitis or acute bacterial infection. ? ?Started on IV Unasyn and IV Decadron and admitted to stepdown unit for close monitoring.   ?

## 2021-11-14 LAB — BASIC METABOLIC PANEL
Anion gap: 4 — ABNORMAL LOW (ref 5–15)
BUN: 12 mg/dL (ref 6–20)
CO2: 28 mmol/L (ref 22–32)
Calcium: 8.3 mg/dL — ABNORMAL LOW (ref 8.9–10.3)
Chloride: 106 mmol/L (ref 98–111)
Creatinine, Ser: 0.72 mg/dL (ref 0.61–1.24)
GFR, Estimated: >60 mL/min (ref >60)
Glucose, Bld: 138 mg/dL — ABNORMAL HIGH (ref 70–99)
Potassium: 3.5 mmol/L (ref 3.5–5.1)
Sodium: 138 mmol/L (ref 135–145)

## 2021-11-14 LAB — MAGNESIUM: Magnesium: 2 mg/dL (ref 1.7–2.4)

## 2021-11-14 LAB — PHOSPHORUS: Phosphorus: 3.1 mg/dL (ref 2.5–4.6)

## 2021-11-14 MED ORDER — AMOXICILLIN-POT CLAVULANATE 875-125 MG PO TABS: 1.0000 | ORAL_TABLET | Freq: Two times a day (BID) | ORAL | 0 refills | Status: AC

## 2021-11-14 MED ORDER — PREDNISONE 10 MG PO TABS: ORAL_TABLET | ORAL | 0 refills | Status: AC

## 2021-11-14 MED ORDER — MAVYRET 100-40 MG PO TABS: 3.0000 | ORAL_TABLET | Freq: Every day | ORAL | 0 refills | Status: DC

## 2021-11-14 MED ORDER — ACETAMINOPHEN 325 MG PO TABS: 650.0000 mg | ORAL_TABLET | Freq: Four times a day (QID) | ORAL | Status: DC | PRN

## 2021-11-14 NOTE — Discharge Summary (Signed)
?Physician Discharge Summary ?  ?Patient: Ryan Petty MRN: [id number] DOB: 1965/09/14  ?Admit date:     11/12/2021  ?Discharge date: 11/19/21  ?Discharge Physician: Pennie Banter  ? ?PCP: Pcp, No  ? ?Recommendations at discharge:  ? ? Follow up with ENT ?Follow up with PCP ?Repeat CBC, BMP in 1-2 weeks ?Follow up with Gastroenterology for Hepatitis C treatment, Rx sent for Mavyret (now on medicaid, previously cost-prohibitive). ? ?Discharge Diagnoses: ?Principal Problem: ?  Laryngitis, acute ?Active Problems: ?  Supraglottitis ?  Nicotine dependence ?  Hepatitis C without hepatic coma ? ? ?Hospital Course: ?HPI on admission: Ryan Petty is a 56 y.o. male with medical history significant for hepatitis C and nicotine dependence who presents to the ER for evaluation of hoarseness and sore throat for about 2 weeks associated with worsening nonproductive cough.  Patient states that he had difficulty lying down because he could not breathe and felt like his throat was closing up on him so he called EMS.  ? ?CT neck soft tissue in the ED showed circumferential soft tissue thickening of the supraglottic larynx. This may indicate laryngitis or early acute supraglottitis. The transverse diameter of the laryngeal airway is narrowed to 3 mm. ? ?Seen by ENT and had a flexible laryngoscopy which showed redness and inflammation in the laryngeal inlet but not as much on the true contrast of false cords.  The arytenoids and aryepiglottic folds are inflamed as well. ?No evidence of epiglottitis or acute bacterial infection. ? ?Started on IV Unasyn and IV Decadron and admitted to stepdown unit for close monitoring.   ? ? ?Patient clinically improved.  Pain and swelling of throat are better, tolerating diet and oral medications.  Stable for discharge home with ENT follow, on PO antibiotic and steroid. ? ? ?Assessment and Plan: ?* Laryngitis, acute ?Patient presents for evaluation of a 2-week history of hoarseness  associated with a sore throat, nonproductive cough and difficulty breathing especially when lying down. ?He is able to control his secretions ?Soft tissue CT showed circumferential soft tissue thickening of the supraglottic larynx. This may indicate laryngitis or early acute supraglottitis. The transverse diameter of the laryngeal airway is narrowed to 3 mm. ?Patient has been seen and evaluated by ENT and had a flexible laryngoscopy which showed redness and inflammation in the laryngeal inlet but not as much on the true contrast of false cords.  The arytenoids and aryepiglottic folds are inflamed as well. ?No evidence of epiglottitis or acute bacterial infection. ?--ENT consulted ?--Treated with Decadron 10 mg IV q6h ?--Treated with Unasyn >> d/c on Augmentin ?--Tolerating soft today and PO hydration ? ? ? ?Supraglottitis ?Treatment as outlined in 1 ? ?Nicotine dependence ?Smoking cessation has been discussed. ?Nicotine patch ordered. ? ?Hepatitis C without hepatic coma ?Followed by Dr. Servando Snare in 2020. ?Started on Mavyret, but was cost prohibitive at the time.  Per pharmacy, pt now has medicaid, likely to be covered.   ?--GI follow up ?--Mavyret Rx sent ?--Viral count pending ? ? ? ? ?  ? ? ?Consultants: ENT ?Procedures performed: none  ?Disposition: Home ?Diet recommendation:  ?Regular diet ?DISCHARGE MEDICATION: ?Allergies as of 11/14/2021   ?No Known Allergies ?  ? ?  ?Medication List  ?  ? ?STOP taking these medications   ? ?THERAFLU COLD & COUGH PO ?  ? ?  ? ?TAKE these medications   ? ?acetaminophen 325 MG tablet ?Commonly known as: TYLENOL ?Take 2 tablets (650 mg total) by mouth  every 6 (six) hours as needed for mild pain (or Fever >/= 101). ?  ?Mavyret 100-40 MG Tabs ?Generic drug: Glecaprevir-Pibrentasvir ?Take 3 tablets by mouth daily. ?  ?predniSONE 10 MG tablet ?Commonly known as: DELTASONE ?Take 4 tablets (40 mg total) by mouth daily with breakfast for 2 days, THEN 2 tablets (20 mg total) daily with  breakfast for 2 days, THEN 1 tablet (10 mg total) daily with breakfast for 2 days. ?Start taking on: November 14, 2021 ?  ? ?  ? ?ASK your doctor about these medications   ? ?amoxicillin-clavulanate 875-125 MG tablet ?Commonly known as: Augmentin ?Take 1 tablet by mouth 2 (two) times daily for 4 days. ?Ask about: Should I take this medication? ?  ? ?  ? ? ?Discharge Exam: ?Filed Weights  ? 11/12/21 0005 11/12/21 2100  ?Weight: 88.5 kg 82.3 kg  ? ?General exam: awake, alert, no acute distress ?HEENT: lymphadenopathy improved, moist mucus membranes, hearing grossly normal  ?Respiratory system: CTAB, no wheezes, rales or rhonchi, normal respiratory effort. ?Cardiovascular system: normal S1/S2, RRR, no JVD, murmurs, rubs, gallops, no pedal edema.   ?Gastrointestinal system: soft, NT, ND, no HSM felt, +bowel sounds. ?Central nervous system: A&O x4. no gross focal neurologic deficits, normal speech ?Extremities: moves all, no edema, normal tone ?Skin: dry, intact, normal temperature ?Psychiatry: normal mood, congruent affect, judgement and insight appear normal ? ? ?Condition at discharge: stable ? ?The results of significant diagnostics from this hospitalization (including imaging, microbiology, ancillary and laboratory) are listed below for reference.  ? ?Imaging Studies: ?CT Soft Tissue Neck W Contrast ? ?Result Date: 11/12/2021 ?CLINICAL DATA:  Epiglottitis or tonsillitis suspected. Pt reports losing voice approx 2 weeks ago. Pt reports tonight throat feels like its closing up and having difficulty lying down because of throat secretions. EXAM: CT NECK WITH CONTRAST TECHNIQUE: Multidetector CT imaging of the neck was performed using the standard protocol following the bolus administration of intravenous contrast. RADIATION DOSE REDUCTION: This exam was performed according to the departmental dose-optimization program which includes automated exposure control, adjustment of the mA and/or kV according to patient size and/or  use of iterative reconstruction technique. CONTRAST:  43mL OMNIPAQUE IOHEXOL 300 MG/ML  SOLN COMPARISON:  None. FINDINGS: PHARYNX AND LARYNX: The nasopharynx, oropharynx and larynx are normal. Visible portions of the oral cavity, tongue base and floor of mouth are normal. Normal epiglottis, vallecula and pyriform sinuses. There is circumferential soft tissue thickening of the supraglottic larynx. The transverse diameter of the laryngeal airway is narrowed to 3 mm. No retropharyngeal abscess, effusion or lymphadenopathy. SALIVARY GLANDS: Normal parotid, submandibular and sublingual glands. THYROID: Normal. LYMPH NODES: No enlarged or abnormal density lymph nodes. VASCULAR: Major cervical vessels are patent. LIMITED INTRACRANIAL: Normal. VISUALIZED ORBITS: Normal. MASTOIDS AND VISUALIZED PARANASAL SINUSES: No fluid levels or advanced mucosal thickening. No mastoid effusion. SKELETON: No bony spinal canal stenosis. No lytic or blastic lesions. UPPER CHEST: Clear. OTHER: None. IMPRESSION: Circumferential soft tissue thickening of the supraglottic larynx. This may indicate laryngitis or early acute supraglottitis. The transverse diameter of the laryngeal airway is narrowed to 3 mm. These results were called by telephone at the time of interpretation on 11/12/2021 at 3:23 am to provider [facility] , who verbally acknowledged these results. Electronically Signed   By: Deatra Robinson M.D.   On: 11/12/2021 03:23  ? ?DG Chest Portable 1 View ? ?Result Date: 11/12/2021 ?CLINICAL DATA:  Cough and shortness of breath EXAM: PORTABLE CHEST 1 VIEW COMPARISON:  06/25/2020 FINDINGS:  The heart size and mediastinal contours are within normal limits. Both lungs are clear. The visualized skeletal structures are unremarkable. IMPRESSION: No active disease. Electronically Signed   By: Deatra RobinsonKevin  Herman M.D.   On: 11/12/2021 01:21   ? ?Microbiology: ?Results for orders placed or performed during the hospital encounter of 11/12/21  ?Group A  Strep by PCR ([facility] Only)     Status: None  ? Collection Time: 11/12/21 12:17 AM  ? Specimen: Throat; Sterile Swab  ?Result Value Ref Range Status  ? Group A Strep by PCR NOT DETECTED NOT DETECTED Final  ?  Comment:

## 2021-11-14 NOTE — Progress Notes (Signed)
PHARMACY CONSULT NOTE - FOLLOW UP ? ?Pharmacy Consult for Electrolyte Monitoring and Replacement  ? ?Recent Labs: ?Potassium (mmol/L)  ?Date Value  ?11/14/2021 3.5  ? ?Magnesium (mg/dL)  ?Date Value  ?11/14/2021 2.0  ? ?Calcium (mg/dL)  ?Date Value  ?11/14/2021 8.3 (L)  ? ?Albumin (g/dL)  ?Date Value  ?01/13/2019 4.0  ? ?Phosphorus (mg/dL)  ?Date Value  ?11/14/2021 3.1  ? ?Sodium (mmol/L)  ?Date Value  ?11/14/2021 138  ? ? ?Assessment: ?56 year old male presenting with hoarseness. Admitted with laryngitis. PMH hepatitis C.  ? ?Diet: thin liquids ? ?Goal of Therapy:  ?Electrolytes within normal limits ? ?Plan:  ?Electrolytes WNL ?Pharmacy to sign off. Please reconsult if needed. ? ?Thank you for allowing pharmacy to be a part of this patient?s care. ? ?Darrick Penna, PharmD, MS PGPM ?Clinical Pharmacist ?11/14/2021 ?7:20 AM ? ? ?

## 2021-11-14 NOTE — TOC Initial Note (Signed)
Transition of Care (TOC) - Initial/Assessment Note  ? ? ?Patient Details  ?Name: Ryan Petty ?MRN: [id number] ?Date of Birth: 1966-02-17 ? ?Transition of Care (TOC) CM/SW Contact:    ?Chapman Fitch, RN ?Phone Number: ?11/14/2021, 11:37 AM ? ?Clinical Narrative:                 ? ? ?  ?  ?Transition of Care (TOC) Screening Note ? ? ?Patient Details  ?Name: Ryan Petty ?Date of Birth: 05-22-1966 ? ? ?Transition of Care (TOC) CM/SW Contact:    ?Chapman Fitch, RN ?Phone Number: ?11/14/2021, 11:37 AM ? ? ? ?Transition of Care Department [facility]) has reviewed patient and no TOC needs have been identified at this time. We will continue to monitor patient advancement through interdisciplinary progression rounds. If new patient transition needs arise, please place a TOC consult. ? ? ?Provided patient with goodrx coupons for discharge medications.  Patient confirms he will be able to obtain medications.  Application provided for Medication Management  and [facility]  ? ? ? ?Patient Goals and CMS Choice ?  ?  ?  ? ?Expected Discharge Plan and Services ?  ?  ?  ?  ?  ?Expected Discharge Date: 11/14/21               ?  ?  ?  ?  ?  ?  ?  ?  ?  ?  ? ?Prior Living Arrangements/Services ?  ?  ?  ?       ?  ?  ?  ?  ? ?Activities of Daily Living ?Home Assistive Devices/Equipment: None ?ADL Screening (condition at time of admission) ?Patient's cognitive ability adequate to safely complete daily activities?: Yes ?Is the patient deaf or have difficulty hearing?: No ?Does the patient have difficulty seeing, even when wearing glasses/contacts?: No ?Does the patient have difficulty concentrating, remembering, or making decisions?: No ?Patient able to express need for assistance with ADLs?: Yes ?Does the patient have difficulty dressing or bathing?: No ?Independently performs ADLs?: Yes (appropriate for developmental age) ?Communication: Independent ?Dressing (OT): Independent ?Grooming: Independent ?Feeding:  Independent ?Bathing: Independent ?Toileting: Independent ?In/Out Bed: Independent ?Walks in Home: Independent ?Does the patient have difficulty walking or climbing stairs?: No ?Weakness of Legs: None ?Weakness of Arms/Hands: None ? ?Permission Sought/Granted ?  ?  ?   ?   ?   ?   ? ?Emotional Assessment ?  ?  ?  ?  ?  ?  ? ?Admission diagnosis:  Supraglottitis [J04.30] ?Supraglottitis without airway obstruction [J04.30] ?Patient Active Problem List  ? Diagnosis Date Noted  ? Supraglottitis 11/12/2021  ? Laryngitis, acute 11/12/2021  ? Nicotine dependence 11/12/2021  ? Hepatitis C without hepatic coma 01/13/2019  ? ?PCP:  Pcp, No ?Pharmacy:   ?[id number] - [place], [place] - [address] ?[address] ?[place] [address] ?Phone: [phone] Fax: [phone] ? ? ? ? ?Social Determinants of Health (SDOH) Interventions ?  ? ?Readmission Risk Interventions ?   ? View : No data to display.  ?  ?  ?  ? ? ? ?

## 2021-11-14 NOTE — Progress Notes (Signed)
Patient discharged and states to drive home himself. Patient verbalized prescribed medications to be taken and understand location of pharmacy to pick up drugs. PIV removed, clean, dry and intact. Patient personal belongings returned to patient.  ?

## 2021-11-15 LAB — HCV RNA QUANT
HCV Quantitative Log: 6.588 log10 IU/mL (ref >1.70)
HCV Quantitative: 3870000 IU/mL (ref >50)

## 2022-04-05 ENCOUNTER — Encounter: Payer: Self-pay | Admitting: Emergency Medicine

## 2022-04-05 ENCOUNTER — Emergency Department: Payer: Medicaid Other

## 2022-04-05 ENCOUNTER — Other Ambulatory Visit: Payer: Self-pay

## 2022-04-05 ENCOUNTER — Emergency Department
Admission: EM | Admit: 2022-04-05 | Discharge: 2022-04-05 | Disposition: A | Discharge status: Home / Self Care | Payer: Self-pay | Attending: Emergency Medicine | Admitting: Emergency Medicine

## 2022-04-05 DIAGNOSIS — S0181XA Laceration without foreign body of other part of head, initial encounter: Secondary | ICD-10-CM | POA: Insufficient documentation

## 2022-04-05 DIAGNOSIS — S00432A Contusion of left ear, initial encounter: Secondary | ICD-10-CM

## 2022-04-05 DIAGNOSIS — W19XXXA Unspecified fall, initial encounter: Secondary | ICD-10-CM | POA: Insufficient documentation

## 2022-04-05 DIAGNOSIS — S40012A Contusion of left shoulder, initial encounter: Secondary | ICD-10-CM | POA: Insufficient documentation

## 2022-04-05 DIAGNOSIS — S01312A Laceration without foreign body of left ear, initial encounter: Secondary | ICD-10-CM | POA: Insufficient documentation

## 2022-04-05 DIAGNOSIS — Z23 Encounter for immunization: Secondary | ICD-10-CM | POA: Insufficient documentation

## 2022-04-05 HISTORY — DX: Unspecified viral hepatitis C without hepatic coma: B19.20

## 2022-04-05 MED ORDER — LEVOFLOXACIN 750 MG PO TABS: 750.0000 mg | ORAL_TABLET | Freq: Every day | ORAL | 0 refills | Status: AC

## 2022-04-05 MED ORDER — LIDOCAINE HCL 1 % IJ SOLN
30.0000 mL | Freq: Once | INTRAMUSCULAR | Status: AC
  Administered 2022-04-05: 30 mL
  Filled 2022-04-05: qty 30

## 2022-04-05 MED ORDER — LEVOFLOXACIN 750 MG PO TABS
750.0000 mg | ORAL_TABLET | Freq: Every day | ORAL | Status: DC
  Administered 2022-04-05: 750 mg via ORAL
  Filled 2022-04-05: qty 1

## 2022-04-05 MED ORDER — TETANUS-DIPHTH-ACELL PERTUSSIS 5-2.5-18.5 LF-MCG/0.5 IM SUSY
0.5000 mL | PREFILLED_SYRINGE | Freq: Once | INTRAMUSCULAR | Status: AC
  Administered 2022-04-05: 0.5 mL via INTRAMUSCULAR
  Filled 2022-04-05: qty 0.5
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
## 2022-04-05 NOTE — ED Notes (Signed)
Patient is resting on the stretcher, easily awakened. NAD. Patient ate entire ED sandwich tray.

## 2022-04-05 NOTE — ED Notes (Signed)
The pt's daughter Trula Ore called the ED to check on her father. After the pt consented to let us speak with his daughter she was advised of his current presentation and reason for his ED visit. The daughter agreed to be contacted in two hours after he has sobered up some and reevaluate the pt being transported home. Trula Ore [phone]
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
## 2022-04-05 NOTE — ED Notes (Signed)
The pt has ambulated multiple times into the hallway and had to be redirected back to his room. The pt still appears to be intoxicated and stammering back and forth. The pt was advised he would need transport home. The pt's mother was contacted but advised she is unable to provide him transport home. The pt is unable to provide any other numbers or contacts to provide him with transport home. Dr. Sidney Ace advised she did not feel comfortable allowing the pt travel by cab home unattended. The pt has been given a food tray and drinks.

## 2022-04-05 NOTE — ED Notes (Signed)
Patient was restless on the stretcher. When asked if he was in pain or needed anything, patient denies any discomfort.

## 2022-04-05 NOTE — Discharge Instructions (Signed)
You had an auricular hematoma which is a hematoma of your ear.  Please take the antibiotic once a day for the next 7 days to prevent infection.  Please follow-up with the ear nose and throat doctors to have the sutures and dressing removed.

## 2022-04-05 NOTE — ED Provider Notes (Signed)
Spoke with the patient's daughter Trula Ore.  She is planning to come pick the patient up.   Georga Hacking, MD 04/05/22 1919

## 2022-04-05 NOTE — ED Notes (Signed)
Light green and lavender tubes collected along with a urine and sent to the lab.
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
## 2022-05-31 IMAGING — CT CT ANGIO CHEST
4 of 7 series · 18 of 46 positions shown · IV contrast (omnipaque)
Comparison: None.

CLINICAL DATA: Chest pain, dizziness

EXAM:
CT ANGIOGRAPHY CHEST WITH CONTRAST
TECHNIQUE: Multidetector CT imaging of the chest was performed using the
standard protocol during bolus administration of intravenous
contrast. Multiplanar CT image reconstructions and MIPs were
obtained to evaluate the vascular anatomy.
CONTRAST:  100mL OMNIPAQUE IOHEXOL 350 MG/ML SOLN

[Series 3: axial pre · axial · non-contrast · 0.71mm/px · z∈[-781,-591]mm · 5 of 58 slices shown]
[im 10/58  lung]
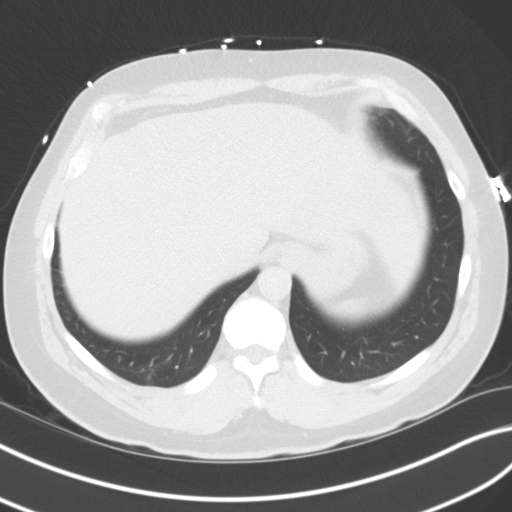
[im 20/58  lung]
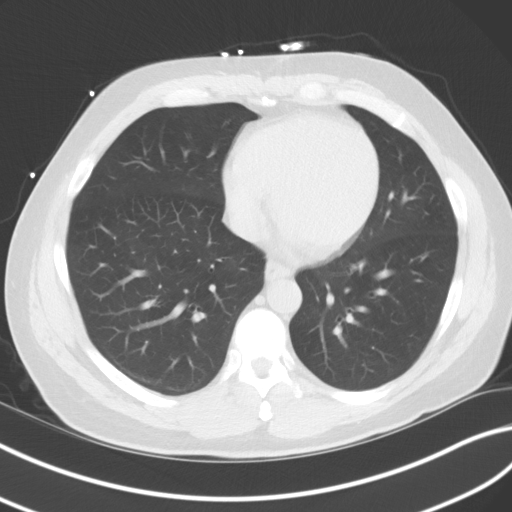
[im 29/58  lung]
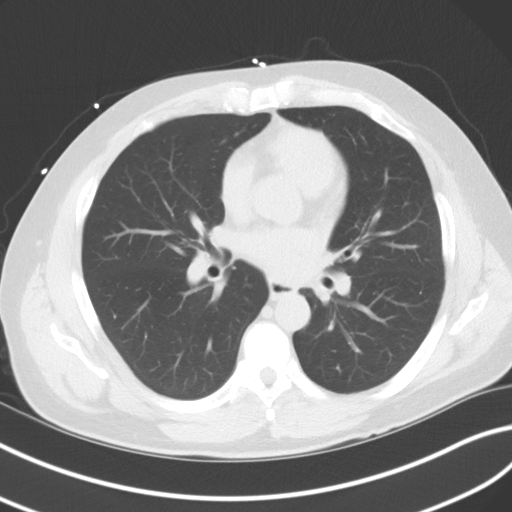
[im 39/58  lung]
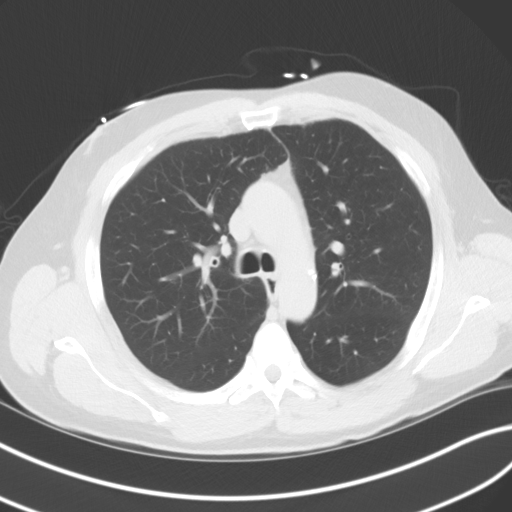
[im 48/58  lung]
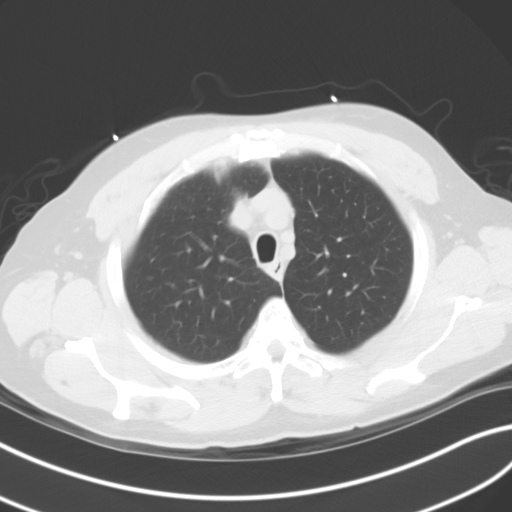

[Series 6: axial arterial · axial · arterial · 0.71mm/px · z∈[-799,-574]mm · 8 of 97 slices shown]
[im 11/97  lung]
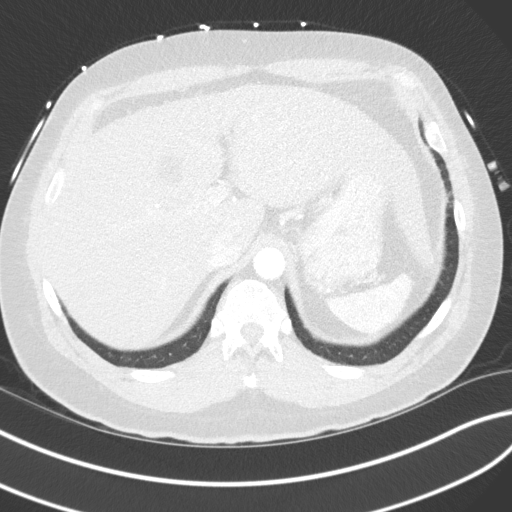
[im 22/97  soft-tissue]
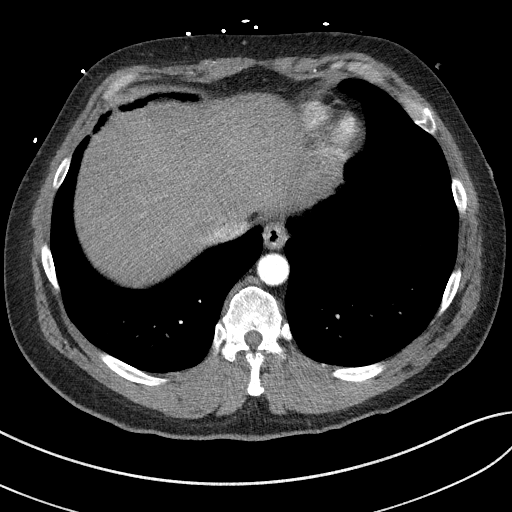
[im 33/97  lung]
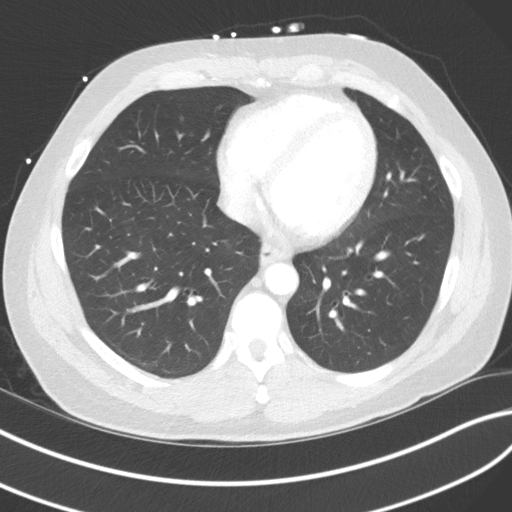
[im 43/97  soft-tissue]
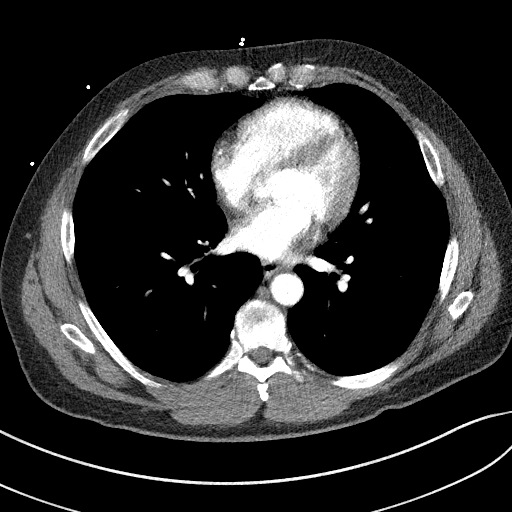
[im 54/97  lung]
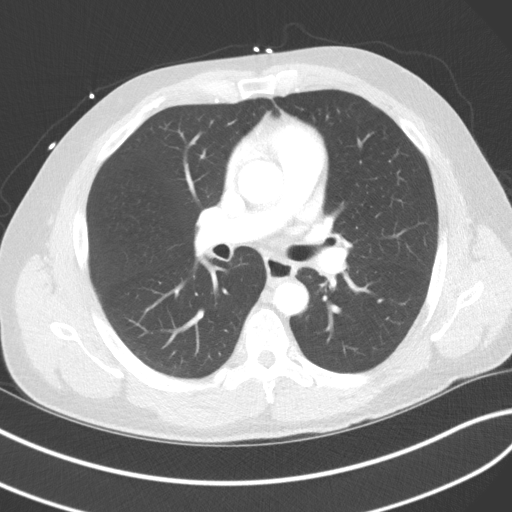
[im 65/97  soft-tissue]
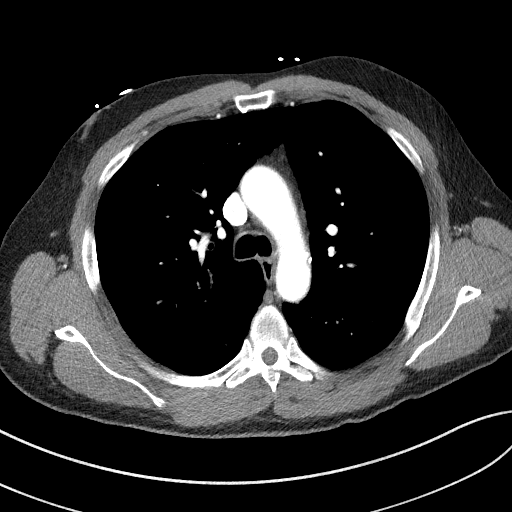
[im 75/97  lung]
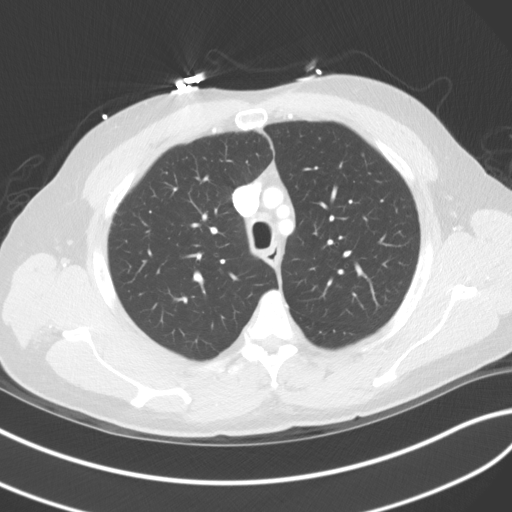
[im 86/97  soft-tissue]
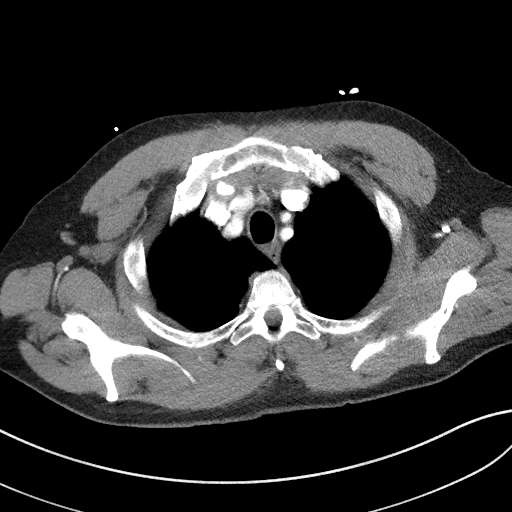

[Series 7: lung · axial · 0.71mm/px · z∈[-809,-769]mm · 2 of 145 slices shown]
[im 11/145  soft-tissue]
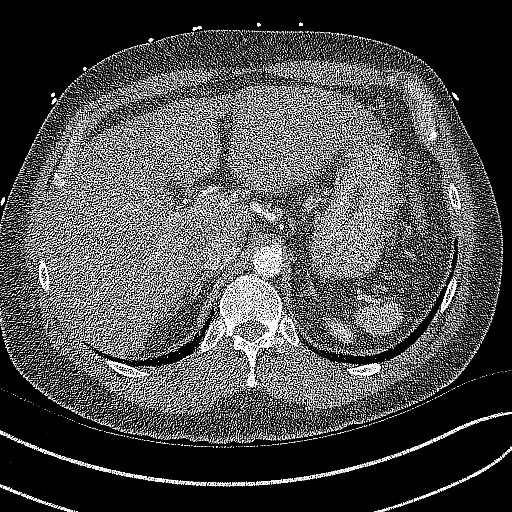
[im 31/145  soft-tissue]
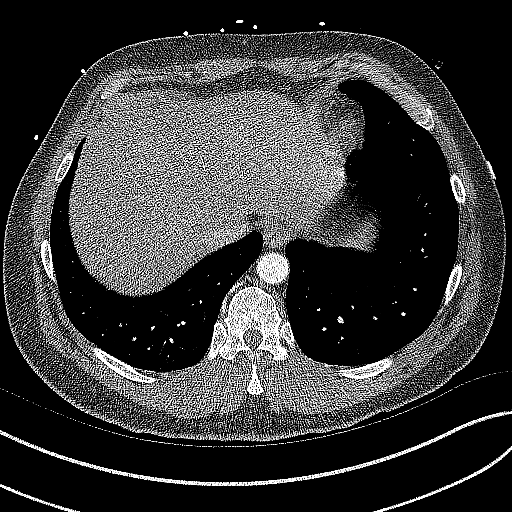

[Series 9: coronals · coronal · 0.57mm/px · 3 of 147 slices shown]
[im 37/147  soft-tissue]
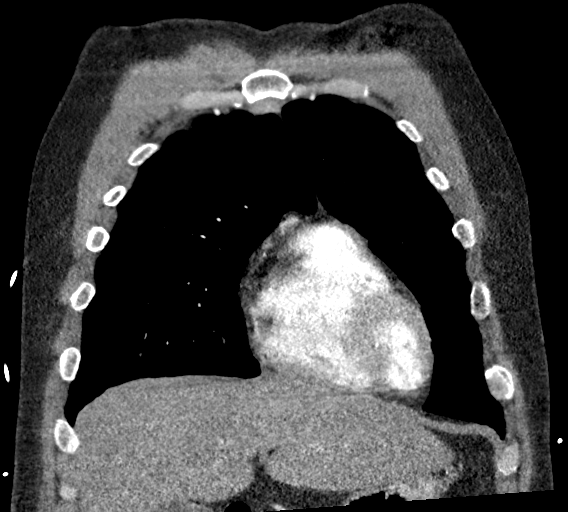
[im 74/147  soft-tissue]
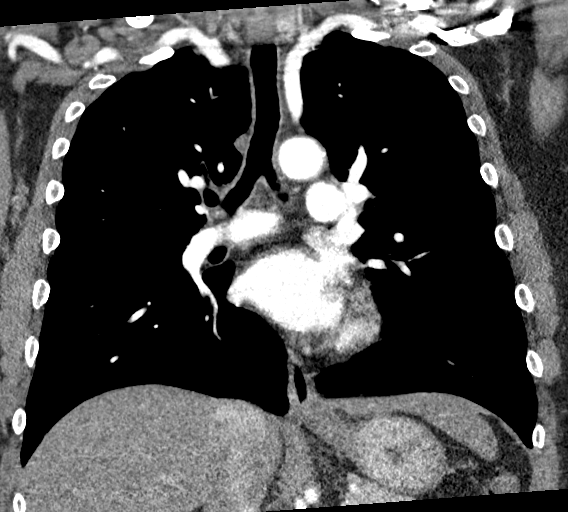
[im 110/147  soft-tissue]
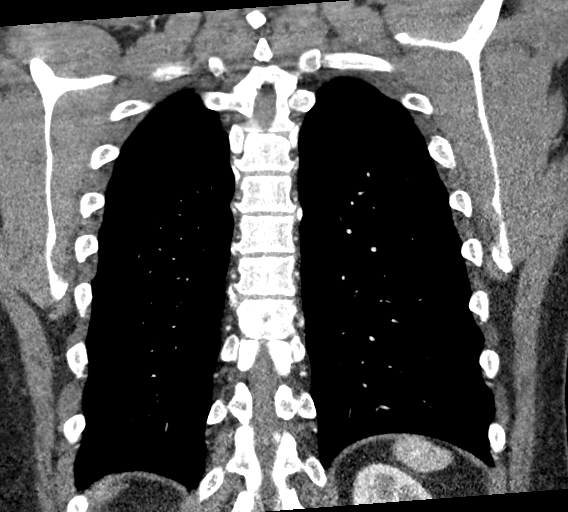

[18 of 46 positions shown; findings below may reference images not displayed]

FINDINGS: Cardiovascular: The thoracic aorta is normal in course and caliber.
No intramural hematoma, aneurysm, or dissection. Minimal
atherosclerotic calcification noted within the aortic arch.

There is adequate opacification of the pulmonary arterial tree. No
intraluminal filling defects identified to suggest acute pulmonary
embolism through the segmental level. The central pulmonary arteries
are of normal caliber.

Cardiac size is within normal limits. No significant coronary artery
calcification. No pericardial effusion.

Mediastinum/Nodes: The visualized thyroid is unremarkable. No
pathologic thoracic adenopathy. The esophagus is unremarkable.

Lungs/Pleura: Lungs are clear. No pleural effusion or pneumothorax.
No central obstructing lesion.

Upper Abdomen: Multiple simple cysts within the left hepatic lobe.
Limited images of the upper abdomen are otherwise unremarkable.

Musculoskeletal: Healed bilateral rib fractures are noted. No acute
bone abnormality. No lytic or blastic bone lesion.

Review of the MIP images confirms the above findings.
IMPRESSION: No evidence of aortic dissection or aneurysm. No acute thoracic
pathology identified.

Aortic Atherosclerosis ([id number]).

## 2022-09-15 ENCOUNTER — Other Ambulatory Visit: Payer: Self-pay

## 2022-09-15 ENCOUNTER — Emergency Department
Admission: EM | Admit: 2022-09-15 | Discharge: 2022-09-15 | Discharge status: Against Medical Advice | Payer: Medicaid Other | Attending: Emergency Medicine | Admitting: Emergency Medicine

## 2022-09-15 ENCOUNTER — Emergency Department: Payer: Medicaid Other

## 2022-09-15 DIAGNOSIS — R0602 Shortness of breath: Secondary | ICD-10-CM | POA: Diagnosis present

## 2022-09-15 DIAGNOSIS — Y908 Blood alcohol level of 240 mg/100 ml or more: Secondary | ICD-10-CM | POA: Insufficient documentation

## 2022-09-15 DIAGNOSIS — Z5321 Procedure and treatment not carried out due to patient leaving prior to being seen by health care provider: Secondary | ICD-10-CM | POA: Insufficient documentation

## 2022-09-15 DIAGNOSIS — F1010 Alcohol abuse, uncomplicated: Secondary | ICD-10-CM | POA: Diagnosis not present

## 2022-09-15 LAB — URINE DRUG SCREEN, QUALITATIVE (ARMC ONLY)
Amphetamines, Ur Screen: NOT DETECTED
Barbiturates, Ur Screen: NOT DETECTED
Benzodiazepine, Ur Scrn: NOT DETECTED
Cannabinoid 50 Ng, Ur ~~LOC~~: NOT DETECTED
Cocaine Metabolite,Ur ~~LOC~~: NOT DETECTED
MDMA (Ecstasy)Ur Screen: NOT DETECTED
Methadone Scn, Ur: NOT DETECTED
Opiate, Ur Screen: NOT DETECTED
Phencyclidine (PCP) Ur S: NOT DETECTED
Tricyclic, Ur Screen: NOT DETECTED

## 2022-09-15 LAB — CBC WITH DIFFERENTIAL/PLATELET
Abs Immature Granulocytes: 0 10*3/uL (ref 0.00–0.07)
Basophils Absolute: 0.1 10*3/uL (ref 0.0–0.1)
Basophils Relative: 1 %
Eosinophils Absolute: 0 10*3/uL (ref 0.0–0.5)
Eosinophils Relative: 0 %
HCT: 39.9 % (ref 39.0–52.0)
Hemoglobin: 14.5 g/dL (ref 13.0–17.0)
Immature Granulocytes: 0 %
Lymphocytes Relative: 49 %
Lymphs Abs: 2.4 10*3/uL (ref 0.7–4.0)
MCH: 35.5 pg — ABNORMAL HIGH (ref 26.0–34.0)
MCHC: 36.3 g/dL — ABNORMAL HIGH (ref 30.0–36.0)
MCV: 97.6 fL (ref 80.0–100.0)
Monocytes Absolute: 0.6 10*3/uL (ref 0.1–1.0)
Monocytes Relative: 13 %
Neutro Abs: 1.8 10*3/uL (ref 1.7–7.7)
Neutrophils Relative %: 37 %
Platelets: 102 10*3/uL — ABNORMAL LOW (ref 150–400)
RBC: 4.09 MIL/uL — ABNORMAL LOW (ref 4.22–5.81)
RDW: 13.9 % (ref 11.5–15.5)
WBC: 4.9 10*3/uL (ref 4.0–10.5)
nRBC: 0 % (ref 0.0–0.2)

## 2022-09-15 LAB — URINALYSIS, ROUTINE W REFLEX MICROSCOPIC
Bilirubin Urine: NEGATIVE
Glucose, UA: NEGATIVE mg/dL
Hgb urine dipstick: NEGATIVE
Ketones, ur: NEGATIVE mg/dL
Leukocytes,Ua: NEGATIVE
Nitrite: NEGATIVE
Protein, ur: NEGATIVE mg/dL
Specific Gravity, Urine: 1.005 (ref 1.005–1.030)
pH: 6 (ref 5.0–8.0)

## 2022-09-15 LAB — ETHANOL: Alcohol, Ethyl (B): 313 mg/dL (ref <10)

## 2022-09-15 LAB — COMPREHENSIVE METABOLIC PANEL
ALT: 54 U/L — ABNORMAL HIGH (ref 0–44)
AST: 89 U/L — ABNORMAL HIGH (ref 15–41)
Albumin: 4.1 g/dL (ref 3.5–5.0)
Alkaline Phosphatase: 67 U/L (ref 38–126)
Anion gap: 14 (ref 5–15)
BUN: 7 mg/dL (ref 6–20)
CO2: 23 mmol/L (ref 22–32)
Calcium: 8.5 mg/dL — ABNORMAL LOW (ref 8.9–10.3)
Chloride: 99 mmol/L (ref 98–111)
Creatinine, Ser: 0.83 mg/dL (ref 0.61–1.24)
GFR, Estimated: >60 mL/min (ref >60)
Glucose, Bld: 107 mg/dL — ABNORMAL HIGH (ref 70–99)
Potassium: 3.7 mmol/L (ref 3.5–5.1)
Sodium: 136 mmol/L (ref 135–145)
Total Bilirubin: 1.2 mg/dL (ref 0.3–1.2)
Total Protein: 7.7 g/dL (ref 6.5–8.1)

## 2022-09-15 NOTE — ED Notes (Signed)
No answer when called several times from lobby 

## 2022-09-15 NOTE — ED Triage Notes (Addendum)
Patient states has had difficulty breathing x 3-4 months, was seen 3-4 months ago for laryngitis and states he has felt this way since then. States he does "not feel like myself." Endorses drinking alcohol today.

## 2022-09-19 ENCOUNTER — Emergency Department
Admission: EM | Admit: 2022-09-19 | Discharge: 2022-09-20 | Disposition: A | Discharge status: Other Acute Inpatient Hospital | Payer: Medicaid Other | Attending: Emergency Medicine | Admitting: Emergency Medicine

## 2022-09-19 ENCOUNTER — Other Ambulatory Visit: Payer: Self-pay

## 2022-09-19 ENCOUNTER — Emergency Department: Payer: Medicaid Other

## 2022-09-19 DIAGNOSIS — Z20822 Contact with and (suspected) exposure to covid-19: Secondary | ICD-10-CM | POA: Insufficient documentation

## 2022-09-19 DIAGNOSIS — F17200 Nicotine dependence, unspecified, uncomplicated: Secondary | ICD-10-CM | POA: Diagnosis not present

## 2022-09-19 DIAGNOSIS — F1020 Alcohol dependence, uncomplicated: Secondary | ICD-10-CM | POA: Insufficient documentation

## 2022-09-19 DIAGNOSIS — F332 Major depressive disorder, recurrent severe without psychotic features: Secondary | ICD-10-CM | POA: Insufficient documentation

## 2022-09-19 DIAGNOSIS — W19XXXA Unspecified fall, initial encounter: Secondary | ICD-10-CM | POA: Insufficient documentation

## 2022-09-19 DIAGNOSIS — Z23 Encounter for immunization: Secondary | ICD-10-CM | POA: Insufficient documentation

## 2022-09-19 DIAGNOSIS — R202 Paresthesia of skin: Secondary | ICD-10-CM | POA: Insufficient documentation

## 2022-09-19 DIAGNOSIS — F331 Major depressive disorder, recurrent, moderate: Secondary | ICD-10-CM | POA: Insufficient documentation

## 2022-09-19 DIAGNOSIS — S0990XA Unspecified injury of head, initial encounter: Secondary | ICD-10-CM | POA: Insufficient documentation

## 2022-09-19 DIAGNOSIS — F1010 Alcohol abuse, uncomplicated: Secondary | ICD-10-CM | POA: Diagnosis not present

## 2022-09-19 DIAGNOSIS — R197 Diarrhea, unspecified: Secondary | ICD-10-CM | POA: Insufficient documentation

## 2022-09-19 DIAGNOSIS — F10129 Alcohol abuse with intoxication, unspecified: Secondary | ICD-10-CM | POA: Insufficient documentation

## 2022-09-19 LAB — TROPONIN I (HIGH SENSITIVITY)
Troponin I (High Sensitivity): 6 ng/L (ref <18)
Troponin I (High Sensitivity): 8 ng/L (ref <18)

## 2022-09-19 LAB — COMPREHENSIVE METABOLIC PANEL
ALT: 48 U/L — ABNORMAL HIGH (ref 0–44)
AST: 97 U/L — ABNORMAL HIGH (ref 15–41)
Albumin: 3.8 g/dL (ref 3.5–5.0)
Alkaline Phosphatase: 60 U/L (ref 38–126)
Anion gap: 12 (ref 5–15)
BUN: 8 mg/dL (ref 6–20)
CO2: 25 mmol/L (ref 22–32)
Calcium: 7.9 mg/dL — ABNORMAL LOW (ref 8.9–10.3)
Chloride: 106 mmol/L (ref 98–111)
Creatinine, Ser: 0.92 mg/dL (ref 0.61–1.24)
GFR, Estimated: >60 mL/min (ref >60)
Glucose, Bld: 83 mg/dL (ref 70–99)
Potassium: 3.9 mmol/L (ref 3.5–5.1)
Sodium: 143 mmol/L (ref 135–145)
Total Bilirubin: 0.9 mg/dL (ref 0.3–1.2)
Total Protein: 7 g/dL (ref 6.5–8.1)

## 2022-09-19 LAB — CBC WITH DIFFERENTIAL/PLATELET
Abs Immature Granulocytes: 0.03 10*3/uL (ref 0.00–0.07)
Basophils Absolute: 0.1 10*3/uL (ref 0.0–0.1)
Basophils Relative: 1 %
Eosinophils Absolute: 0.1 10*3/uL (ref 0.0–0.5)
Eosinophils Relative: 1 %
HCT: 41.7 % (ref 39.0–52.0)
Hemoglobin: 14.6 g/dL (ref 13.0–17.0)
Immature Granulocytes: 1 %
Lymphocytes Relative: 31 %
Lymphs Abs: 1.9 10*3/uL (ref 0.7–4.0)
MCH: 35.2 pg — ABNORMAL HIGH (ref 26.0–34.0)
MCHC: 35 g/dL (ref 30.0–36.0)
MCV: 100.5 fL — ABNORMAL HIGH (ref 80.0–100.0)
Monocytes Absolute: 0.7 10*3/uL (ref 0.1–1.0)
Monocytes Relative: 11 %
Neutro Abs: 3.5 10*3/uL (ref 1.7–7.7)
Neutrophils Relative %: 55 %
Platelets: 96 10*3/uL — ABNORMAL LOW (ref 150–400)
RBC: 4.15 MIL/uL — ABNORMAL LOW (ref 4.22–5.81)
RDW: 14.9 % (ref 11.5–15.5)
WBC: 6.3 10*3/uL (ref 4.0–10.5)
nRBC: 0 % (ref 0.0–0.2)

## 2022-09-19 LAB — RESP PANEL BY RT-PCR (RSV, FLU A&B, COVID)  RVPGX2
Influenza A by PCR: NEGATIVE
Influenza B by PCR: NEGATIVE
Resp Syncytial Virus by PCR: NEGATIVE
SARS Coronavirus 2 by RT PCR: NEGATIVE

## 2022-09-19 LAB — URINALYSIS, ROUTINE W REFLEX MICROSCOPIC
Bilirubin Urine: NEGATIVE
Glucose, UA: NEGATIVE mg/dL
Hgb urine dipstick: NEGATIVE
Ketones, ur: NEGATIVE mg/dL
Leukocytes,Ua: NEGATIVE
Nitrite: NEGATIVE
Protein, ur: NEGATIVE mg/dL
Specific Gravity, Urine: 1.004 — ABNORMAL LOW (ref 1.005–1.030)
pH: 5 (ref 5.0–8.0)

## 2022-09-19 LAB — URINE DRUG SCREEN, QUALITATIVE (ARMC ONLY)
Amphetamines, Ur Screen: NOT DETECTED
Barbiturates, Ur Screen: NOT DETECTED
Benzodiazepine, Ur Scrn: NOT DETECTED
Cannabinoid 50 Ng, Ur ~~LOC~~: NOT DETECTED
Cocaine Metabolite,Ur ~~LOC~~: NOT DETECTED
MDMA (Ecstasy)Ur Screen: NOT DETECTED
Methadone Scn, Ur: NOT DETECTED
Opiate, Ur Screen: NOT DETECTED
Phencyclidine (PCP) Ur S: NOT DETECTED
Tricyclic, Ur Screen: NOT DETECTED

## 2022-09-19 LAB — LIPASE, BLOOD: Lipase: 51 U/L (ref 11–51)

## 2022-09-19 LAB — GROUP A STREP BY PCR: Group A Strep by PCR: NOT DETECTED

## 2022-09-19 MED ORDER — THIAMINE MONONITRATE 100 MG PO TABS: 100.0000 mg | ORAL_TABLET | Freq: Every day | ORAL | Status: DC

## 2022-09-19 MED ORDER — LOPERAMIDE HCL 2 MG PO CAPS: 2.0000 mg | ORAL_CAPSULE | ORAL | Status: DC | PRN

## 2022-09-19 MED ORDER — THIAMINE HCL 100 MG/ML IJ SOLN
100.0000 mg | Freq: Once | INTRAMUSCULAR | Status: AC
  Administered 2022-09-19: 100 mg via INTRAMUSCULAR
  Filled 2022-09-19: qty 2

## 2022-09-19 MED ORDER — LORAZEPAM 1 MG PO TABS: 1.0000 mg | ORAL_TABLET | Freq: Three times a day (TID) | ORAL | Status: DC

## 2022-09-19 MED ORDER — LORAZEPAM 1 MG PO TABS: 1.0000 mg | ORAL_TABLET | Freq: Two times a day (BID) | ORAL | Status: DC

## 2022-09-19 MED ORDER — TETANUS-DIPHTH-ACELL PERTUSSIS 5-2.5-18.5 LF-MCG/0.5 IM SUSY
0.5000 mL | PREFILLED_SYRINGE | Freq: Once | INTRAMUSCULAR | Status: AC
  Administered 2022-09-19: 0.5 mL via INTRAMUSCULAR
  Filled 2022-09-19: qty 0.5

## 2022-09-19 MED ORDER — LORAZEPAM 1 MG PO TABS: 1.0000 mg | ORAL_TABLET | Freq: Every day | ORAL | Status: DC

## 2022-09-19 MED ORDER — ADULT MULTIVITAMIN W/MINERALS CH
1.0000 | ORAL_TABLET | Freq: Every day | ORAL | Status: DC
  Administered 2022-09-19: 1 via ORAL
  Filled 2022-09-19: qty 1

## 2022-09-19 MED ORDER — IOHEXOL 300 MG/ML  SOLN
75.0000 mL | Freq: Once | INTRAMUSCULAR | Status: AC | PRN
  Administered 2022-09-19: 75 mL via INTRAVENOUS

## 2022-09-19 MED ORDER — HYDROXYZINE HCL 25 MG PO TABS: 25.0000 mg | ORAL_TABLET | Freq: Four times a day (QID) | ORAL | Status: DC | PRN

## 2022-09-19 MED ORDER — LORAZEPAM 1 MG PO TABS: 1.0000 mg | ORAL_TABLET | Freq: Four times a day (QID) | ORAL | Status: DC | PRN

## 2022-09-19 MED ORDER — LORAZEPAM 1 MG PO TABS
1.0000 mg | ORAL_TABLET | Freq: Four times a day (QID) | ORAL | Status: DC
  Administered 2022-09-19 (×2): 1 mg via ORAL
  Filled 2022-09-19 (×2): qty 1

## 2022-09-19 MED ORDER — ONDANSETRON 4 MG PO TBDP: 4.0000 mg | ORAL_TABLET | Freq: Four times a day (QID) | ORAL | Status: DC | PRN
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
## 2022-09-19 NOTE — ED Notes (Signed)
Pt ambulated to the nurses station and was guided back to his stretcher.

## 2022-09-19 NOTE — ED Notes (Signed)
TTS at bedside. 

## 2022-09-19 NOTE — ED Notes (Addendum)
Drank 1-2x 40 oz beers today. States numbness in bilateral feet and feet have been swollen last couple months. Denies pain.

## 2022-09-19 NOTE — ED Provider Notes (Addendum)
[facility] Provider Note    Event Date/Time   First MD Initiated Contact with Patient 09/19/22 1147     (approximate)   History   Fall (Fall last night and diarrhea, alcohol last 3-4 days)   HPI  Ryan Petty is a 57 y.o. male with history of hepatitis C, nicotine dependence who comes in with concerns for falls, EtOH abuse.  I reviewed records patient was seen previously back in April 2023 for supraglottitis treated with Unasyn and Decadron and had to be admitted to the emergency room.  He reportedly had some laryngitis again that has not been clearing up.  He also reports some falls last fall last night. Reports no abdominal pain, no chest pain, no SOB.  Has been having some diarrhea as well.    Physical Exam   Triage Vital Signs: ED Triage Vitals  Enc Vitals Group     BP 09/19/22 1144 (!) 143/110     Pulse Rate 09/19/22 1144 84     Resp 09/19/22 1144 20     Temp 09/19/22 1144 97.7 F (36.5 C)     Temp Source 09/19/22 1144 Oral     SpO2 09/19/22 1144 100 %     Weight --      Height --      Head Circumference --      Peak Flow --      Pain Score 09/19/22 1151 0     Pain Loc --      Pain Edu? --      Excl. in [place]? --     Most recent vital signs: Vitals:   09/19/22 1144 09/19/22 1149  BP: (!) 143/110   Pulse: 84   Resp: 20   Temp: 97.7 F (36.5 C)   SpO2: 100% 100%     General: Awake, no distress.  CV:  Good peripheral perfusion.  Resp:  Normal effort.  Abd:  No distention.  Other:  Soft nontender abdomen.  Equal strength in arms and legs.  Some bruising and dried blood noted underneath his left eye.  Hoarse voice noted Equal strength in arms and legs.  Able to point down the toes down and lift them up.  Sensation intact bilaterally.  He reports some tingling in his feet that has been going on for months.  ED Results / Procedures / Treatments   Labs (all labs ordered are listed, but only abnormal results are displayed) Labs  Reviewed  CBC WITH DIFFERENTIAL/PLATELET - Abnormal; Notable for the following components:      Result Value   RBC 4.15 (*)    MCV 100.5 (*)    MCH 35.2 (*)    Platelets 96 (*)    All other components within normal limits  URINALYSIS, ROUTINE W REFLEX MICROSCOPIC - Abnormal; Notable for the following components:   Color, Urine STRAW (*)    APPearance CLEAR (*)    Specific Gravity, Urine 1.004 (*)    All other components within normal limits  RESP PANEL BY RT-PCR (RSV, FLU A&B, COVID)  [id number]  GROUP A STREP BY PCR  COMPREHENSIVE METABOLIC PANEL  LIPASE, BLOOD  URINE DRUG SCREEN, QUALITATIVE ([facility] ONLY)  TROPONIN I (HIGH SENSITIVITY)     EKG  My interpretation of EKG:  Sinus rate of 84 without any ST elevation or T wave inversions normal intervals  RADIOLOGY  Pending   PROCEDURES:  Critical Care performed: No  Procedures   MEDICATIONS ORDERED IN ED: Medications  Tdap (BOOSTRIX) injection 0.5 mL (0.5 mLs Intramuscular Given 09/19/22 1240)     IMPRESSION / MDM / ASSESSMENT AND PLAN / ED COURSE  I reviewed the triage vital signs and the nursing notes.   Patient's presentation is most consistent with acute presentation with potential threat to life or bodily function.   Patient comes with multiple falls, EtOH abuse.  Abdomen soft and nontender.  Will get CT imaging to rule out any kind of acute pathology as well as CT neck given very hoarse voice with history of glotitis.  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  No neurological symptoms noted on examination no new back pain to suggest cord compression   CBC is reassuring.  CMP shows slightly elevated LFTs similar to prior lipase normal urine without evidence of UTI TI.  COVID, flu are negative.   Patient does report some vague SI but he denies any plan.  Did psych consultation but this time patient is voluntary and does not meet IVC criteria  Patient  handed off to oncoming team pending CT imaging and reassessment.  The patient is on the cardiac monitor to evaluate for evidence of arrhythmia and/or significant heart rate changes.      FINAL CLINICAL IMPRESSION(S) / ED DIAGNOSES   Final diagnoses:  ETOH abuse  Injury of head, initial encounter     Rx / DC Orders   ED Discharge Orders     None        Note:  This document was prepared using Dragon voice recognition software and may include unintentional dictation errors.   Vanessa Buckhorn, MD 09/19/22 1412    Vanessa Oriska, MD 09/19/22 [phone]

## 2022-09-19 NOTE — BH Assessment (Signed)
Unable to complete TTS consult. Patient too intoxicated to participate in interview.

## 2022-09-19 NOTE — BH Assessment (Signed)
Comprehensive Clinical Assessment (CCA) Note  09/19/2022 Ryan Petty [id number]  Recommendations for Services/Supports/Treatments: Consulted with Ryan D., NP, who determined pt. meets inpatient criteria. Notified Dr. Quentin Petty and Ryan Flock, RN aware of disposition recommendation.  Ryan Petty is a 57 year old, English speaking, Black male with no known psych hx. Pt presented to [facility] ED voluntarily for alcohol intoxication. On assessment, pt. reports drinking unknown amount of beer prior to arrival and on a daily basis. Pt reported that he has experienced difficulty sleeping for 3 days, despite drinking alcohol to try to sleep. Pt identified his stressors as finances, losing his girlfriend 1 year ago, and his mother having cancer. Pt endorsed feelings of overwhelm and depression. Pt reported that he is struggling with grief emotions and has no support. The pt. denied having withdrawal symptoms, despite having visible tremors. The pt. had shallow insight and impaired judgement. Pt was cooperative throughout the assessment. Pt had slurred speech. Pt was oriented x4 and had a disheveled appearance. Pt presented with a depressed mood; affect was sad. Pt's BAL was 313 upon arrival. Pt unable to endorsed the presence of hallucinations, explaining that he hears birds or his expired girlfriend "Ryan Petty". Pt endorsed passive SI, explaining that he does not intend to harm himself; however, he wouldn't mind passing on in his sleep. Pt denied current HI.    Chief Complaint:  Chief Complaint  Patient presents with   Fall    Fall last night and diarrhea, alcohol last 3-4 days   Visit Diagnosis: Alcohol use disorder, severe    CCA Screening, Triage and Referral (STR)  Patient Reported Information How did you hear about [place]? Self  Referral name: No data recorded Referral phone number: No data recorded  Whom do you see for routine medical problems? No data recorded Practice/Facility Name: No data  recorded Practice/Facility Phone Number: No data recorded Name of Contact: No data recorded Contact Number: No data recorded Contact Fax Number: No data recorded Prescriber Name: No data recorded Prescriber Address (if known): No data recorded  What Is the Reason for Your Visit/Call Today? Pt requested neighbor to call EMS for a fall he had last night. He denies pain. Reports diarrhea and alcohol intake the last 3-4 days. States he had alcohol today.  How Long Has This Been Causing You Problems? 1 wk - 1 month  What Do You Feel Would Help You the Most Today? Treatment for Depression or other mood problem; Medication(s)   Have You Recently Been in Any Inpatient Treatment (Hospital/Detox/Crisis Center/28-Day Program)? No data recorded Name/Location of Program/Hospital:No data recorded How Long Were You There? No data recorded When Were You Discharged? No data recorded  Have You Ever Received Services From [facility] Before? No data recorded Who Do You See at [facility]? No data recorded  Have You Recently Had Any Thoughts About Hurting Yourself? No  Are You Planning to Commit Suicide/Harm Yourself At This time? No   Have you Recently Had Thoughts About [place]? No  Explanation: Pt endorsed passive SI, stating that he sometimes wants to die in his sleep.   Have You Used Any Alcohol or Drugs in the Past 24 Hours? Yes  How Long Ago Did You Use Drugs or Alcohol? No data recorded What Did You Use and How Much? Unknown amount of beer   Do You Currently Have a Therapist/Psychiatrist? No  Name of Therapist/Psychiatrist: n/a   Have You Been Recently Discharged From Any Office Practice or Programs? No  Explanation  of Discharge From Practice/Program: n/a     CCA Screening Triage Referral Assessment Type of Contact: Face-to-Face  Is this Initial or Reassessment? No data recorded Date Telepsych consult ordered in [facility]:  No data recorded Time Telepsych consult  ordered in [facility]:  No data recorded  Patient Reported Information Reviewed? No data recorded Patient Left Without Being Seen? No data recorded Reason for Not Completing Assessment: No data recorded  Collateral Involvement: None provided   Does Patient Have a [place]? No data recorded Name and Contact of Legal Guardian: No data recorded If Minor and Not Living with Parent(s), Who has Custody? n/a  Is CPS involved or ever been involved? Never  Is APS involved or ever been involved? Never   Patient Determined To Be At Risk for Harm To Self or Others Based on Review of Patient Reported Information or Presenting Complaint? No  Method: No Plan  Availability of Means: No access or NA  Intent: Vague intent or NA  Notification Required: No need or identified person  Additional Information for Danger to Others Potential: -- (n/a)  Additional Comments for Danger to Others Potential: n/a  Are There Guns or Other Weapons in Your Home? No  Types of Guns/Weapons: n/a  Are These Weapons Safely Secured?                            No  Who Could Verify You Are Able To Have These Secured: n/a  Do You Have any Outstanding Charges, Pending Court Dates, Parole/Probation? None reported  Contacted To Inform of Risk of Harm To Self or Others: -- (n/a)   Location of Assessment: [facility] ED   Does Patient Present under Involuntary Commitment? No  IVC Papers Initial File Date: No data recorded  [place] of Residence:    Patient Currently Receiving the Following Services: Not Receiving Services   Determination of Need: Emergent (2 hours)   Options For Referral: ED Visit     CCA Biopsychosocial Intake/Chief Complaint:  No data recorded Current Symptoms/Problems: No data recorded  Patient Reported Schizophrenia/Schizoaffective Diagnosis in Past: No   Strengths: Pt has stable housing; pt is able to ask for help; pt is receptive to recieving  treatment.  Preferences: No data recorded Abilities: No data recorded  Type of Services Patient Feels are Needed: No data recorded  Initial Clinical Notes/Concerns: No data recorded  Mental Health Symptoms Depression:   Hopelessness; Sleep (too much or little); Change in energy/activity; Irritability   Duration of Depressive symptoms:  Greater than two weeks   Mania:   None   Anxiety:    Sleep; Tension; Worrying   Psychosis:   Hallucinations   Duration of Psychotic symptoms:  Less than six months   Trauma:   Emotional numbing; Guilt/shame   Obsessions:   Intrusive/time consuming; Good insight; Disrupts routine/functioning; Cause anxiety; Recurrent & persistent thoughts/impulses/images   Compulsions:   Good insight; "Driven" to perform behaviors/acts; Disrupts with routine/functioning; Intended to reduce stress or prevent another outcome; Intrusive/time consuming; Repeated behaviors/mental acts   Inattention:   N/A   Hyperactivity/Impulsivity:   Feeling of restlessness   Oppositional/Defiant Behaviors:   None   Emotional Irregularity:   Chronic feelings of emptiness; Intense/unstable relationships   Other Mood/Personality Symptoms:   Pt expressed feelings of grief about a deceased girlfriend.    Mental Status Exam Appearance and self-care  Stature:   Average   Weight:   Average weight  Clothing:   Dirty   Grooming:   Neglected   Cosmetic use:   None   Posture/gait:   Slumped   Motor activity:   Tremor   Sensorium  Attention:   Distractible   Concentration:   Anxiety interferes   Orientation:   Person; Place; Situation; Object   Recall/memory:   Normal   Affect and Mood  Affect:   Depressed   Mood:   Depressed   Relating  Eye contact:   Normal   Facial expression:   Sad   Attitude toward examiner:   Cooperative   Thought and Language  Speech flow:  Slurred   Thought content:   Appropriate to Mood and  Circumstances   Preoccupation:   Ruminations   Hallucinations:   Auditory   Organization:  No data recorded  [employer] of Knowledge:   Good   Intelligence:   Average   Abstraction:   Normal   Judgement:   Impaired   Reality Testing:   Adequate   Insight:   Shallow; Present   Decision Making:   Impulsive   Social Functioning  Social Maturity:   Isolates   Social Judgement:   "Street Smart"   Stress  Stressors:   Grief/losses; Family conflict   Coping Ability:   Exhausted   Skill Deficits:   Self-care   Supports:   Support needed     Religion: Religion/Spirituality Are You A Religious Person?: No How Might This Affect Treatment?: n/a  Leisure/Recreation: Leisure / Recreation Do You Have Hobbies?: No  Exercise/Diet: Exercise/Diet Do You Exercise?: No Have You Gained or Lost A Significant Amount of Weight in the Past Six Months?: No Do You Follow a Special Diet?: No Do You Have Any Trouble Sleeping?: Yes Explanation of Sleeping Difficulties: Pt reported that he cannot sleep for days at a time.   CCA Employment/Education Employment/Work Situation: Employment / Work Technical sales engineer: Unemployed Has Patient ever Been in Passenger transport manager?: No  Education: Education Is Patient Currently Attending School?: No Did Physicist, medical?: No Did You Have An Individualized Education Program (IIEP): No Did You Have Any Difficulty At [employer]?: No Patient's Education Has Been Impacted by Current Illness: No   CCA Family/Childhood History Family and Relationship History: Family history Marital status: Single Does patient have children?: Yes How many children?: 1 How is patient's relationship with their children?: Pt reported that he does not have a relationship with daughter.  Childhood History:  Childhood History By whom was/is the patient raised?: Mother Did patient suffer any verbal/emotional/physical/sexual abuse  as a child?: No Did patient suffer from severe childhood neglect?: No Has patient ever been sexually abused/assaulted/raped as an adolescent or adult?: No Was the patient ever a victim of a crime or a disaster?: No Witnessed domestic violence?: No Has patient been affected by domestic violence as an adult?: No  Child/Adolescent Assessment:     CCA Substance Use Alcohol/Drug Use: Alcohol / Drug Use Pain Medications: See MAR Prescriptions: See MAR Over the Counter: See MAR History of alcohol / drug use?: Yes Longest period of sobriety (when/how long): 2 years Negative Consequences of Use: Personal relationships, Financial Withdrawal Symptoms: Tremors Substance #1 Name of Substance 1: Alcohol 1 - Age of First Use: Unknown 1 - Amount (size/oz): Unknown 1 - Frequency: Daily 1 - Duration: Ongoing 1 - Last Use / Amount: 09/19/22  ASAM's:  Six Dimensions of Multidimensional Assessment  Dimension 1:  Acute Intoxication and/or Withdrawal Potential:   Dimension 1:  Description of individual's past and current experiences of substance use and withdrawal: Pt has a hx of chronic alcohol abuse  Dimension 2:  Biomedical Conditions and Complications:   Dimension 2:  Description of patient's biomedical conditions and  complications: Pt has a hep C Dx  Dimension 3:  Emotional, Behavioral, or Cognitive Conditions and Complications:  Dimension 3:  Description of emotional, behavioral, or cognitive conditions and complications: Pt has prolonged grief  Dimension 4:  Readiness to Change:     Dimension 5:  Relapse, Continued use, or Continued Problem Potential:     Dimension 6:  Recovery/Living Environment:     ASAM Severity Score: ASAM's Severity Rating Score: 16  ASAM Recommended Level of Treatment: ASAM Recommended Level of Treatment: Level III Residential Treatment   Substance use Disorder (SUD) Substance Use Disorder (SUD)  Checklist Symptoms of Substance Use:  Continued use despite having a persistent/recurrent physical/psychological problem caused/exacerbated by use, Continued use despite persistent or recurrent social, interpersonal problems, caused or exacerbated by use, Evidence of tolerance  Recommendations for Services/Supports/Treatments: Recommendations for Services/Supports/Treatments Recommendations For Services/Supports/Treatments: Inpatient Hospitalization, Detox, Individual Therapy, SAIOP (Substance Abuse Intensive Outpatient Program)  DSM5 Diagnoses: Patient Active Problem List   Diagnosis Date Noted   Supraglottitis 11/12/2021   Laryngitis, acute 11/12/2021   Nicotine dependence 11/12/2021   Hepatitis C without hepatic coma 01/13/2019   Ryan Petty R Aoi Kouns, LCAS

## 2022-09-19 NOTE — ED Notes (Signed)
Pt's neighbor Robina Ade @ [phone]

## 2022-09-19 NOTE — ED Notes (Signed)
Pt requested something to drink. This RN gave him soda pop and peanut butter questions

## 2022-09-19 NOTE — ED Notes (Signed)
Pt denies thoughts of killing himself. Stated "I know those n---- want me dead so I'm going to live since those n--- want me dead".

## 2022-09-19 NOTE — ED Triage Notes (Signed)
Pt requested neighbor to call EMS for a fall he had last night. He denies pain. Reports diarrhea and alcohol intake the last 3-4 days. States he had alcohol today.

## 2022-09-19 NOTE — ED Notes (Signed)
Pt resting with eyes closed in bed, equal chest rise and fall noted. Pt appears in no obvious distress at this time.

## 2022-09-19 NOTE — ED Provider Notes (Signed)
Patient medically cleared.  Evaluated by psychiatry who has offered inpatient therapy and treatment.  Does not meet criteria for IVC.  Patient agreeable to plan for admission.  The patient has been placed in psychiatric observation due to the need to provide a safe environment for the patient while obtaining psychiatric consultation and evaluation, as well as ongoing medical and medication management to treat the patient's condition.    Merlyn Lot, MD 09/19/22 2211

## 2022-09-19 NOTE — ED Notes (Signed)
Pt states "I am losing my mind" because he is "hearing my girlfriend and chirping birds". Pt states girlfriend passed away a year ago. States he increased alcohol intake since her passing.

## 2022-09-20 ENCOUNTER — Encounter (HOSPITAL_COMMUNITY): Payer: Self-pay | Admitting: Psychiatry

## 2022-09-20 ENCOUNTER — Ambulatory Visit (HOSPITAL_COMMUNITY)
Admission: EM | Admit: 2022-09-20 | Discharge: 2022-09-20 | Disposition: A | Discharge status: Home / Self Care | Payer: Medicaid Other | Attending: Psychiatry | Admitting: Psychiatry

## 2022-09-20 DIAGNOSIS — S0990XA Unspecified injury of head, initial encounter: Secondary | ICD-10-CM

## 2022-09-20 DIAGNOSIS — F1290 Cannabis use, unspecified, uncomplicated: Secondary | ICD-10-CM | POA: Insufficient documentation

## 2022-09-20 DIAGNOSIS — F1010 Alcohol abuse, uncomplicated: Secondary | ICD-10-CM | POA: Insufficient documentation

## 2022-09-20 DIAGNOSIS — F332 Major depressive disorder, recurrent severe without psychotic features: Secondary | ICD-10-CM

## 2022-09-20 DIAGNOSIS — Z8619 Personal history of other infectious and parasitic diseases: Secondary | ICD-10-CM | POA: Insufficient documentation

## 2022-09-20 DIAGNOSIS — F17210 Nicotine dependence, cigarettes, uncomplicated: Secondary | ICD-10-CM | POA: Insufficient documentation

## 2022-09-20 NOTE — ED Provider Notes (Signed)
Behavioral Health Urgent Care Medical Screening Exam  Patient Name: Ryan Petty MRN: [id number] Date of Evaluation: 09/20/22 Chief Complaint:  I want to go home Diagnosis:  Final diagnoses:  Alcohol abuse    History of Present illness: Ryan Petty is a 57 y.o. male.  He reports he has had a lot of stressors.  He reports acutely that he has been having trouble sleeping the last few nights so has been using alcohol more than normal to try to sleep.  He reports that his drinking really started after his girlfriend of 3 years passed away about 1 year ago.  He reports another stressor is that his mother has cancer.  He reports that while at [facility] the counselor was talking to him about his alcohol use but he thought he was being transferred to get treatment for the numbness and tingling in his feet which has been there for a few months.  He reports he does not want alcohol detox or substance abuse treatment.  He reports he just would like to go home.  He reports no significant past psychiatric history.  He reports no history of suicide attempts.  He reports no history of self-injurious behavior.  He reports no history of psychiatric hospitalizations.  He reports past medical history significant for hep C.  He reports past surgical history significant for left hand finger repair.  He reports no history of head trauma or seizures.  He reports NKDA.  He reports he currently lives in a house with his mother.  He reports he is not currently working.  He reports he has a GED.  He reports drinking 2-3 40 ounce beers a day.  He reports no history of DTs or withdrawal seizures.  He reports smoking 1 pack/day of cigarettes.  He reports infrequent THC use.  He reports he does have a DWI.  He reports no access to firearms.  When offered alcohol detox or substance abuse treatment he reports that he is not interested in them at this time.  When asked about the comments made of SI he reports that he was  misunderstood.  He reports that there have been a few times in the past where he wished he would not wake up but he never had any intent/plan to act on those and that it has been a while since he had those.  He reports no SI, HI, or AVH.  He reports no other concerns at present.  [place] ED from 09/19/2022 in [facility] Emergency Department at [facility] ED from 09/15/2022 in [facility] Emergency Department at [facility] ED from 04/05/2022 in [facility] Emergency Department at [place] No Risk No Risk       Psychiatric Specialty Exam  Presentation  General Appearance:Casual  Eye Contact:Good  Speech:Clear and Coherent  Speech Volume:Normal  Handedness:Right   Mood and Affect  Mood: Anxious  Affect: Congruent   Thought Process  Thought Processes: Coherent; Linear  Descriptions of Associations:Intact  Orientation:Full (Time, Place and Person)  Thought Content:Logical; WDL  Diagnosis of Schizophrenia or Schizoaffective disorder in past: No   Hallucinations:None  Ideas of Reference:None  Suicidal Thoughts:No  Homicidal Thoughts:No   Sensorium  Memory: Immediate Fair; Recent Fair  Judgment: Fair  Insight:Fair   Executive Functions  Concentration: Good  Attention Span: Good  Recall: Good  Fund of Knowledge: Good  Language: Good   Psychomotor Activity  Psychomotor Activity:Normal   Assets  Assets: Communication  Skills; Desire for Improvement; Resilience; Housing   Sleep  Sleep: Poor  Number of hours: No data recorded  Physical Exam: Physical Exam Vitals and nursing note reviewed.  Constitutional:      General: He is not in acute distress.    Appearance: Normal appearance. He is not ill-appearing, toxic-appearing or diaphoretic.  HENT:     Head: Normocephalic and atraumatic.  Pulmonary:     Effort: Pulmonary effort is normal.  Neurological:     General: No focal  deficit present.     Mental Status: He is alert.    Review of Systems  Respiratory:  Negative for cough and shortness of breath.   Cardiovascular:  Negative for chest pain.  Gastrointestinal:  Negative for abdominal pain, constipation, diarrhea, nausea and vomiting.  Neurological:  Negative for dizziness, weakness and headaches.  Psychiatric/Behavioral:  Positive for depression (mild). Negative for hallucinations and suicidal ideas. The patient is not nervous/anxious.    Blood pressure (!) 153/84, pulse 86, temperature 98.4 F (36.9 C), temperature source Oral, resp. rate 18, SpO2 94 %. There is no height or weight on file to calculate BMI.  Musculoskeletal: Strength & Muscle Tone: within normal limits Gait & Station: normal Patient leans: N/A   [facility] Discharge Disposition for Follow up and Recommendations:  Based on my evaluation the patient does not appear to have an emergency medical condition and can be discharged.   Briant Cedar, MD 09/20/2022, 8:07 AM

## 2022-09-20 NOTE — BH Assessment (Signed)
Patient has been accepted to [facility].  Accepting physician is Leandro Reasoner, NP.  Call report to [phone].  Representative was T[place], AC.   ER Staff is aware of it:  Holley Raring, ER Secretary  Dr. Leonides Schanz, ER MD  Yetta Flock, Patient's Nurse     Patient can be transported anytime after 8 AM 09/20/22.

## 2022-09-20 NOTE — Discharge Instructions (Addendum)
Substance Abuse Resources  [facility] - Admissions are currently completed Monday through Friday at [place]; both appointments and walk-ins are accepted.  Any individual that is a [facility] resident may present for a substance abuse screening and assessment for admission.  A person may be referred by numerous sources or self-refer.   Potential clients will be screened for medical necessity and appropriateness for the program.  Clients must meet criteria for high-intensity residential treatment services.  If clinically appropriate, a client will continue with the comprehensive clinical assessment and intake process, as well as enrollment in the [place].   Address: [address] [place], [place] [address] Admin Hours: Mon-Fri 8AM to [place] Hours: 24/7 Phone: [phone] Fax: [phone]   [facility] (Detox) Facility Based Crisis:  These are 3 locations for services: Please call before arrival    Address: [address] [place], [place] [address] Phone: [phone]   Address: [address] [place], [place] [address] Phone#: [phone]   Address: [address] [place], [place] [address] Phone#: [phone]     [facility] ([facility]): (offers outpatient therapy and intensive outpatient substance abuse therapy).  [address] [place], [place] [address] Phone: [phone]   [place]: Offers FREE recovery skills classes, support groups, 1:1 Peer Support, and Compeer Classes. [address] [place], [place] [address] Phone: [phone] (Call to complete intake).  [facility] [address] [place], [place] [address] Phone: [phone] ext: [facility] provides food, shelter and other programs and services to the homeless men of [place] through our [employer].   By offering safe shelter, three meals a day,  clean clothing, Biblical counseling, financial planning, vocational training, GED/education and employment assistance, we've helped mend the shattered lives of many homeless men since opening in 1974.   We have approximately 267 beds available, with a max of 312 beds including mats for emergency situations and currently house an average of 270 men a night.   Prospective Client Check-In Information Photo ID Required (State/ Out of State/ [place]) - if photo ID is not available, clients are required to have a printout of a police/sheriff's criminal history report. Help out with chores around the [place]. No sex offender of any type (pending, charged, registered and/or any other sex related offenses) will be permitted to check in. Must be willing to abide by all rules, regulations, and policies established by the [employer]. The following will be provided - shelter, food, clothing, and biblical counseling. If you or someone you know is in need of assistance at our [facility] shelter in [place], [place], please call [phone] ext. [phone].   [facility]-will provide timely access to mental health services for children and adolescents (4-17) and adults presenting in a mental health crisis. The program is designed for those who need urgent Behavioral Health or Substance Use treatment and are not experiencing a medical crisis that would typically require an emergency room visit.    [place], [place] [address] Phone: [phone] [facility]: Phone#: [phone]   The [facility] SA Intensive Outpatient Program (SAIOP) means structured individual and group addiction activities and services that are provided at an outpatient program designed to assist adult and adolescent consumers to begin recovery and learn skills for recovery maintenance. The [place] program is offered at  least 3 hours a  day, 3 days a week.SAIOP services shall include a structured program consisting of, but not limited to, the following services: Individual counseling and support; Group counseling and support; Family counseling, training or support; Biochemical assays to identify recent drug use (e.g., urine drug screens); Strategies for relapse prevention to include community and social support systems in treatment; Life skills; Crisis contingency planning; Disease Management; and Treatment support activities that have been adapted or specifically designed for persons with physical disabilities, or persons with co-occurring disorders of mental illness and substance abuse/dependence or mental retardation/developmental disability and substance abuse/dependence. Phone: [place] [address] [place], [place] [address] Phone: [phone] Admissions team is available 24/7  Phone: [phone]. Fax: [phone]   [facility]     Admissions [address] [place], [place] [address] [phone]    [facility] [place]: [phone]  Behavioral Health Crisis Line: [phone]

## 2022-09-20 NOTE — BH Assessment (Signed)
Consents for treatment signed.

## 2022-09-20 NOTE — Consult Note (Signed)
[facility] Consult   Reason for Consult:  Evaluation Referring Physician:  Dr. Quentin Cornwall Patient Identification: Ryan Petty MRN:  [id number] Principal Diagnosis: MDD (major depressive disorder), recurrent episode, severe ([place]) Diagnosis:  Principal Problem:   MDD (major depressive disorder), recurrent episode, severe ([place]) Active Problems:   Alcohol abuse with intoxication ([place])   Total Time spent with patient: 45 minutes  HPI: Per TTS Ryan Petty is a 57 year old, English speaking, Black male with no known psych hx. Pt presented to [facility] ED voluntarily for alcohol intoxication. On assessment, pt. reports drinking unknown amount of beer prior to arrival and on a daily basis. Pt reported that he has experienced difficulty sleeping for 3 days, despite drinking alcohol to try to sleep. Pt identified his stressors as finances, losing his girlfriend 1 year ago, and his mother having cancer. Pt endorsed feelings of overwhelm and depression. Pt reported that he is struggling with grief emotions and has no support. The pt. denied having withdrawal symptoms, despite having visible tremors. The pt. had shallow insight and impaired judgement. Pt was cooperative throughout the assessment. Pt had slurred speech. Pt was oriented x4 and had a disheveled appearance. Pt presented with a depressed mood; affect was sad. Pt's BAL was 313 upon arrival. Pt unable to endorsed the presence of hallucinations, explaining that he hears birds or his expired girlfriend "[name]". Pt endorsed passive SI, explaining that he does not intend to harm himself; however, he wouldn't mind passing on in his sleep. Pt denied current HI.   is a   During evaluation Ryan Petty is laying in bed(position), he is disoriented ; restless/semi-cooperative; and mood congruent with affect.  Patient is hardly speaking. His thought process is coherent and at time irrelevant; There is no indication that he is currently  responding to internal/external stimuli or experiencing delusional thought content.  Patient endorses suicidal/self-harm/ and denies psychosis, and paranoia.    Past Psychiatric History: MDD  Risk to Self:   Risk to Others:   Prior Inpatient Therapy:   Prior Outpatient Therapy:    Past Medical History:  Past Medical History:  Diagnosis Date   Hepatitis C     Past Surgical History:  Procedure Laterality Date   FRACTURE SURGERY     Family History:  Family History  Problem Relation Age of Onset   Throat cancer Mother    Alcohol abuse Father    Dementia Maternal Grandmother    Family Psychiatric  History:  Social History:  Social History   Substance and Sexual Activity  Alcohol Use Yes   Comment: "2beers/day"     Social History   Substance and Sexual Activity  Drug Use Not on file    Social History   Socioeconomic History   Marital status: Single    Spouse name: Not on file   Number of children: Not on file   Years of education: Not on file   Highest education level: Not on file  Occupational History   Not on file  Tobacco Use   Smoking status: Every Day   Smokeless tobacco: Never  Vaping Use   Vaping Use: Never used  Substance and Sexual Activity   Alcohol use: Yes    Comment: "2beers/day"   Drug use: Not on file   Sexual activity: Not on file  Other Topics Concern   Not on file  Social History Narrative   Not on file   Social Determinants of Health   Financial Resource Strain: Not on  file  Food Insecurity: Not on file  Transportation Needs: Not on file  Physical Activity: Not on file  Stress: Not on file  Social Connections: Not on file   Additional Social History:    Allergies:  No Known Allergies  Labs:  Results for orders placed or performed during the hospital encounter of 09/19/22 (from the past 48 hour(s))  CBC with Differential     Status: Abnormal   Collection Time: 09/19/22 12:07 PM  Result Value Ref Range   WBC 6.3 4.0 - 10.5  K/uL   RBC 4.15 (L) 4.22 - 5.81 MIL/uL   Hemoglobin 14.6 13.0 - 17.0 g/dL   HCT 41.7 39.0 - 52.0 %   MCV 100.5 (H) 80.0 - 100.0 fL   MCH 35.2 (H) 26.0 - 34.0 pg   MCHC 35.0 30.0 - 36.0 g/dL   RDW 14.9 11.5 - 15.5 %   Platelets 96 (L) 150 - 400 K/uL   nRBC 0.0 0.0 - 0.2 %   Neutrophils Relative % 55 %   Neutro Abs 3.5 1.7 - 7.7 K/uL   Lymphocytes Relative 31 %   Lymphs Abs 1.9 0.7 - 4.0 K/uL   Monocytes Relative 11 %   Monocytes Absolute 0.7 0.1 - 1.0 K/uL   Eosinophils Relative 1 %   Eosinophils Absolute 0.1 0.0 - 0.5 K/uL   Basophils Relative 1 %   Basophils Absolute 0.1 0.0 - 0.1 K/uL   Immature Granulocytes 1 %   Abs Immature Granulocytes 0.03 0.00 - 0.07 K/uL    Comment: Performed at [facility], [place], [place] [address]  Troponin I (High Sensitivity)     Status: None   Collection Time: 09/19/22 12:07 PM  Result Value Ref Range   Troponin I (High Sensitivity) 6 <18 ng/L    Comment: (NOTE) Elevated high sensitivity troponin I (hsTnI) values and significant  changes across serial measurements may suggest ACS but many other  chronic and acute conditions are known to elevate hsTnI results.  Refer to the Links section for chest pain algorithms and additional  guidance. Performed at [facility], [address] [place], [place] [address]   Resp panel by RT-PCR (RSV, Flu A&B, Covid) Urine, Clean Catch     Status: None   Collection Time: 09/19/22 12:39 PM   Specimen: Urine, Clean Catch; Nasal Swab  Result Value Ref Range   SARS Coronavirus 2 by RT PCR NEGATIVE NEGATIVE    Comment: (NOTE) SARS-CoV-2 target nucleic acids are NOT DETECTED.  The SARS-CoV-2 RNA is generally detectable in upper respiratory specimens during the acute phase of infection. The lowest concentration of SARS-CoV-2 viral copies this assay can detect is 138 copies/mL. A negative result does not preclude SARS-Cov-2 infection and should not be used as the sole basis  for treatment or other patient management decisions. A negative result may occur with  improper specimen collection/handling, submission of specimen other than nasopharyngeal swab, presence of viral mutation(s) within the areas targeted by this assay, and inadequate number of viral copies(<138 copies/mL). A negative result must be combined with clinical observations, patient history, and epidemiological information. The expected result is Negative.  Fact Sheet for Patients:  EntrepreneurPulse.com.au  Fact Sheet for Healthcare Providers:  IncredibleEmployment.be  This test is no t yet approved or cleared by the [place] FDA and  has been authorized for detection and/or diagnosis of SARS-CoV-2 by FDA under an Emergency Use Authorization (EUA). This EUA will remain  in effect (meaning  this test can be used) for the duration of the COVID-19 declaration under Section 564(b)(1) of the Act, 21 U.S.C.section 360bbb-3(b)(1), unless the authorization is terminated  or revoked sooner.       Influenza A by PCR NEGATIVE NEGATIVE   Influenza B by PCR NEGATIVE NEGATIVE    Comment: (NOTE) The Xpert Xpress SARS-CoV-2/FLU/RSV plus assay is intended as an aid in the diagnosis of influenza from Nasopharyngeal swab specimens and should not be used as a sole basis for treatment. Nasal washings and aspirates are unacceptable for Xpert Xpress SARS-CoV-2/FLU/RSV testing.  Fact Sheet for Patients: EntrepreneurPulse.com.au  Fact Sheet for Healthcare Providers: IncredibleEmployment.be  This test is not yet approved or cleared by the [place] FDA and has been authorized for detection and/or diagnosis of SARS-CoV-2 by FDA under an Emergency Use Authorization (EUA). This EUA will remain in effect (meaning this test can be used) for the duration of the COVID-19 declaration under Section 564(b)(1) of the Act, 21  U.S.C. section 360bbb-3(b)(1), unless the authorization is terminated or revoked.     Resp Syncytial Virus by PCR NEGATIVE NEGATIVE    Comment: (NOTE) Fact Sheet for Patients: EntrepreneurPulse.com.au  Fact Sheet for Healthcare Providers: IncredibleEmployment.be  This test is not yet approved or cleared by the [place] FDA and has been authorized for detection and/or diagnosis of SARS-CoV-2 by FDA under an Emergency Use Authorization (EUA). This EUA will remain in effect (meaning this test can be used) for the duration of the COVID-19 declaration under Section 564(b)(1) of the Act, 21 U.S.C. section 360bbb-3(b)(1), unless the authorization is terminated or revoked.  Performed at [facility], [place], [place] [address]   Group A Strep by PCR [facility] Only)     Status: None   Collection Time: 09/19/22 12:39 PM   Specimen: Urine, Clean Catch; Sterile Swab  Result Value Ref Range   Group A Strep by PCR NOT DETECTED NOT DETECTED    Comment: Performed at [facility], [address] [place], [place] [address]  Urinalysis, Routine w reflex microscopic -Urine, Clean Catch     Status: Abnormal   Collection Time: 09/19/22 12:51 PM  Result Value Ref Range   Color, Urine STRAW (A) YELLOW   APPearance CLEAR (A) CLEAR   Specific Gravity, Urine 1.004 (L) 1.005 - 1.030   pH 5.0 5.0 - 8.0   Glucose, UA NEGATIVE NEGATIVE mg/dL   Hgb urine dipstick NEGATIVE NEGATIVE   Bilirubin Urine NEGATIVE NEGATIVE   Ketones, ur NEGATIVE NEGATIVE mg/dL   Protein, ur NEGATIVE NEGATIVE mg/dL   Nitrite NEGATIVE NEGATIVE   Leukocytes,Ua NEGATIVE NEGATIVE    Comment: Performed at [facility], [address] [place], [place] [address]  Urine Drug Screen, Qualitative ([facility] only)     Status: None   Collection Time: 09/19/22 12:51 PM  Result Value Ref Range   Tricyclic, Ur Screen NONE DETECTED NONE DETECTED   Amphetamines,  Ur Screen NONE DETECTED NONE DETECTED   MDMA (Ecstasy)Ur Screen NONE DETECTED NONE DETECTED   Cocaine Metabolite,Ur [place] NONE DETECTED NONE DETECTED   Opiate, Ur Screen NONE DETECTED NONE DETECTED   Phencyclidine (PCP) Ur S NONE DETECTED NONE DETECTED   Cannabinoid 50 Ng, Ur [place] NONE DETECTED NONE DETECTED   Barbiturates, Ur Screen NONE DETECTED NONE DETECTED   Benzodiazepine, Ur Scrn NONE DETECTED NONE DETECTED   Methadone Scn, Ur NONE DETECTED NONE DETECTED    Comment: (NOTE) Tricyclics + metabolites, urine    Cutoff 1000  ng/mL Amphetamines + metabolites, urine  Cutoff 1000 ng/mL MDMA (Ecstasy), urine              Cutoff 500 ng/mL Cocaine Metabolite, urine          Cutoff 300 ng/mL Opiate + metabolites, urine        Cutoff 300 ng/mL Phencyclidine (PCP), urine         Cutoff 25 ng/mL Cannabinoid, urine                 Cutoff 50 ng/mL Barbiturates + metabolites, urine  Cutoff 200 ng/mL Benzodiazepine, urine              Cutoff 200 ng/mL Methadone, urine                   Cutoff 300 ng/mL  The urine drug screen provides only a preliminary, unconfirmed analytical test result and should not be used for non-medical purposes. Clinical consideration and professional judgment should be applied to any positive drug screen result due to possible interfering substances. A more specific alternate chemical method must be used in order to obtain a confirmed analytical result. Gas chromatography / mass spectrometry (GC/MS) is the preferred confirm atory method. Performed at [facility], [address] [place], [place] [address]   Comprehensive metabolic panel     Status: Abnormal   Collection Time: 09/19/22  1:31 PM  Result Value Ref Range   Sodium 143 135 - 145 mmol/L   Potassium 3.9 3.5 - 5.1 mmol/L   Chloride 106 98 - 111 mmol/L   CO2 25 22 - 32 mmol/L   Glucose, Bld 83 70 - 99 mg/dL    Comment: Glucose reference range applies only to samples taken after fasting for at least 8  hours.   BUN 8 6 - 20 mg/dL   Creatinine, Ser 0.92 0.61 - 1.24 mg/dL   Calcium 7.9 (L) 8.9 - 10.3 mg/dL   Total Protein 7.0 6.5 - 8.1 g/dL   Albumin 3.8 3.5 - 5.0 g/dL   AST 97 (H) 15 - 41 U/L   ALT 48 (H) 0 - 44 U/L   Alkaline Phosphatase 60 38 - 126 U/L   Total Bilirubin 0.9 0.3 - 1.2 mg/dL   GFR, Estimated >60 >60 mL/min    Comment: (NOTE) Calculated using the CKD-EPI Creatinine Equation (2021)    Anion gap 12 5 - 15    Comment: Performed at [facility], [address] [place], [place] [address]  Lipase, blood     Status: None   Collection Time: 09/19/22  1:31 PM  Result Value Ref Range   Lipase 51 11 - 51 U/L    Comment: Performed at [facility], [place], [place] [address]  Troponin I (High Sensitivity)     Status: None   Collection Time: 09/19/22  3:35 PM  Result Value Ref Range   Troponin I (High Sensitivity) 8 <18 ng/L    Comment: (NOTE) Elevated high sensitivity troponin I (hsTnI) values and significant  changes across serial measurements may suggest ACS but many other  chronic and acute conditions are known to elevate hsTnI results.  Refer to the "Links" section for chest pain algorithms and additional  guidance. Performed at [facility], [address] [place], [place] [address]     Current Facility-Administered Medications  Medication Dose Route Frequency Provider Last Rate Last Admin   hydrOXYzine (ATARAX) tablet 25 mg  25 mg Oral Q6H  PRN Patrecia Pour, NP       loperamide (IMODIUM) capsule 2-4 mg  2-4 mg Oral PRN Patrecia Pour, NP       LORazepam (ATIVAN) tablet 1 mg  1 mg Oral Q6H PRN Patrecia Pour, NP       LORazepam (ATIVAN) tablet 1 mg  1 mg Oral QID Patrecia Pour, NP   1 mg at 09/19/22 2202   Followed by   Derrill Memo ON 09/21/2022] LORazepam (ATIVAN) tablet 1 mg  1 mg Oral TID Patrecia Pour, NP       Followed by   Derrill Memo ON 09/22/2022] LORazepam (ATIVAN) tablet 1 mg  1 mg Oral BID Patrecia Pour, NP        Followed by   Derrill Memo ON 09/23/2022] LORazepam (ATIVAN) tablet 1 mg  1 mg Oral Daily Patrecia Pour, NP       multivitamin with minerals tablet 1 tablet  1 tablet Oral Daily Patrecia Pour, NP   1 tablet at 09/19/22 1837   ondansetron (ZOFRAN-ODT) disintegrating tablet 4 mg  4 mg Oral Q6H PRN Patrecia Pour, NP       thiamine (VITAMIN B1) tablet 100 mg  100 mg Oral Daily Patrecia Pour, NP       Current Outpatient Medications  Medication Sig Dispense Refill   acetaminophen (TYLENOL) 325 MG tablet Take 2 tablets (650 mg total) by mouth every 6 (six) hours as needed for mild pain (or Fever >/= 101).     Glecaprevir-Pibrentasvir (MAVYRET) 100-40 MG TABS Take 3 tablets by mouth daily. (Patient not taking: Reported on 09/19/2022) 84 tablet 0    Musculoskeletal: Strength & Muscle Tone: within normal limits Gait & Station: normal, unsteady Patient leans: N/A            Psychiatric Specialty Exam:  Presentation  General Appearance: Disheveled  Eye Contact:None  Speech:Slow; Slurred  Speech Volume:Decreased  Handedness:No data recorded  Mood and Affect  Mood:Anxious; Depressed; Hopeless  Affect:Depressed; Flat   Thought Process  Thought Processes:Disorganized  Descriptions of Associations:Circumstantial  Orientation:Partial  Thought Content:No data recorded History of Schizophrenia/Schizoaffective disorder:No  Duration of Psychotic Symptoms:N/A  Hallucinations:No data recorded Ideas of Reference:No data recorded Suicidal Thoughts:Suicidal Thoughts: Yes, Active  Homicidal Thoughts:No data recorded  Sensorium  Memory:Remote Poor  Judgment:Impaired  Insight:No data recorded  Executive Functions  Concentration:Poor  Attention Span:Poor  Recall:Poor  Fund of Knowledge:Poor  Language:Poor   Psychomotor Activity  Psychomotor Activity:No data recorded  Assets  Assets:Communication Skills; Desire for Improvement   Sleep  Sleep:Sleep:  Poor   Physical Exam: Physical Exam ROS Blood pressure 124/71, pulse 100, temperature 97.7 F (36.5 C), temperature source Oral, resp. rate 18, SpO2 97 %. There is no height or weight on file to calculate BMI.  Treatment Plan Summary: Daily contact with patient to assess and evaluate symptoms and progress in treatment and Medication management  Disposition: Recommend psychiatric Inpatient admission when medically cleared. Supportive therapy provided about ongoing stressors. Discussed crisis plan, support from social network, calling 911, coming to the Emergency Department, and calling Suicide Hotline.  Deloria Lair, NP 09/20/2022 4:59 AM

## 2022-09-20 NOTE — ED Notes (Signed)
Discharge instructions provided and Pt stated understanding. Pt alert, orient and ambulatory prior to d/c from facility. Personal belongings returned from the blue locker. Taxi called for transportation services. Patient escorted to the lobby to wait for taxi. Safety maintained.

## 2022-09-20 NOTE — ED Notes (Signed)
Pt asked what he was waiting on and I explained that he was being admitted for psych reasons. Pt given a warm blanket.

## 2022-09-20 NOTE — ED Provider Notes (Signed)
Emergency Medicine Observation Re-evaluation Note  Ryan Petty is a 57 y.o. male, seen on rounds today.  Pt initially presented to the ED for complaints of Fall (Fall last night and diarrhea, alcohol last 3-4 days)  Currently, the patient is is no acute distress. Denies any concerns at this time.  Physical Exam  Blood pressure 120/77, pulse 85, temperature 97.7 F (36.5 C), temperature source Oral, resp. rate 18, SpO2 96 %.  Physical Exam: General: No apparent distress Pulm: Normal WOB Neuro: Moving all extremities Psych: Resting comfortably     ED Course / MDM     I have reviewed the labs performed to date as well as medications administered while in observation.  Recent changes in the last 24 hours include: No acute events overnight.  Plan   Current plan: Patient awaiting psychiatric disposition. Patient is not under full IVC at this time.  Patient accepted to [facility] in the [place], Delice Bison, DO 09/20/22 [phone]

## 2022-09-20 NOTE — ED Notes (Signed)
Transport is here for pt.

## 2022-09-30 ENCOUNTER — Other Ambulatory Visit (HOSPITAL_COMMUNITY)
Admission: EM | Admit: 2022-09-30 | Discharge: 2022-10-07 | Disposition: A | Discharge status: Home / Self Care | Payer: Medicaid Other | Attending: Psychiatry | Admitting: Psychiatry

## 2022-09-30 ENCOUNTER — Encounter (HOSPITAL_COMMUNITY): Payer: Self-pay | Admitting: Psychiatry

## 2022-09-30 DIAGNOSIS — R200 Anesthesia of skin: Secondary | ICD-10-CM | POA: Insufficient documentation

## 2022-09-30 DIAGNOSIS — R9431 Abnormal electrocardiogram [ECG] [EKG]: Secondary | ICD-10-CM | POA: Insufficient documentation

## 2022-09-30 DIAGNOSIS — Z1152 Encounter for screening for COVID-19: Secondary | ICD-10-CM | POA: Diagnosis not present

## 2022-09-30 DIAGNOSIS — R7989 Other specified abnormal findings of blood chemistry: Secondary | ICD-10-CM | POA: Insufficient documentation

## 2022-09-30 DIAGNOSIS — F10129 Alcohol abuse with intoxication, unspecified: Secondary | ICD-10-CM | POA: Diagnosis present

## 2022-09-30 DIAGNOSIS — R5383 Other fatigue: Secondary | ICD-10-CM | POA: Diagnosis not present

## 2022-09-30 DIAGNOSIS — R000 Tachycardia, unspecified: Secondary | ICD-10-CM | POA: Insufficient documentation

## 2022-09-30 DIAGNOSIS — F10229 Alcohol dependence with intoxication, unspecified: Secondary | ICD-10-CM | POA: Insufficient documentation

## 2022-09-30 DIAGNOSIS — I10 Essential (primary) hypertension: Secondary | ICD-10-CM | POA: Insufficient documentation

## 2022-09-30 DIAGNOSIS — D696 Thrombocytopenia, unspecified: Secondary | ICD-10-CM | POA: Insufficient documentation

## 2022-09-30 DIAGNOSIS — F32A Depression, unspecified: Secondary | ICD-10-CM | POA: Insufficient documentation

## 2022-09-30 DIAGNOSIS — Z79899 Other long term (current) drug therapy: Secondary | ICD-10-CM | POA: Insufficient documentation

## 2022-09-30 DIAGNOSIS — R45851 Suicidal ideations: Secondary | ICD-10-CM | POA: Insufficient documentation

## 2022-09-30 LAB — LIPID PANEL
Cholesterol: 224 mg/dL — ABNORMAL HIGH (ref 0–200)
HDL: >135 mg/dL
Triglycerides: 67 mg/dL
VLDL: 13 mg/dL (ref 0–40)

## 2022-09-30 LAB — COMPREHENSIVE METABOLIC PANEL
ALT: 78 U/L — ABNORMAL HIGH (ref 0–44)
AST: 155 U/L — ABNORMAL HIGH (ref 15–41)
Albumin: 4.2 g/dL (ref 3.5–5.0)
Alkaline Phosphatase: 73 U/L (ref 38–126)
Anion gap: 16 — ABNORMAL HIGH (ref 5–15)
BUN: 8 mg/dL (ref 6–20)
CO2: 24 mmol/L (ref 22–32)
Calcium: 8.5 mg/dL — ABNORMAL LOW (ref 8.9–10.3)
Chloride: 100 mmol/L (ref 98–111)
Creatinine, Ser: 1.01 mg/dL (ref 0.61–1.24)
GFR, Estimated: >60 mL/min (ref >60)
Glucose, Bld: 71 mg/dL (ref 70–99)
Potassium: 4.1 mmol/L (ref 3.5–5.1)
Sodium: 140 mmol/L (ref 135–145)
Total Bilirubin: 1.5 mg/dL — ABNORMAL HIGH (ref 0.3–1.2)
Total Protein: 7.5 g/dL (ref 6.5–8.1)

## 2022-09-30 LAB — POCT URINE DRUG SCREEN - MANUAL ENTRY (I-SCREEN)
POC Amphetamine UR: NOT DETECTED
POC Buprenorphine (BUP): NOT DETECTED
POC Cocaine UR: NOT DETECTED
POC Marijuana UR: POSITIVE — AB
POC Methadone UR: NOT DETECTED
POC Methamphetamine UR: NOT DETECTED
POC Morphine: NOT DETECTED
POC Oxazepam (BZO): NOT DETECTED
POC Oxycodone UR: NOT DETECTED
POC Secobarbital (BAR): NOT DETECTED

## 2022-09-30 LAB — CBC WITH DIFFERENTIAL/PLATELET
Abs Immature Granulocytes: 0 10*3/uL (ref 0.00–0.07)
Basophils Absolute: 0.1 10*3/uL (ref 0.0–0.1)
Basophils Relative: 1 %
Eosinophils Absolute: 0 10*3/uL (ref 0.0–0.5)
Eosinophils Relative: 1 %
HCT: 39.9 % (ref 39.0–52.0)
Hemoglobin: 14.7 g/dL (ref 13.0–17.0)
Immature Granulocytes: 0 %
Lymphocytes Relative: 36 %
Lymphs Abs: 1.6 10*3/uL (ref 0.7–4.0)
MCH: 35.8 pg — ABNORMAL HIGH (ref 26.0–34.0)
MCHC: 36.8 g/dL — ABNORMAL HIGH (ref 30.0–36.0)
MCV: 97.1 fL (ref 80.0–100.0)
Monocytes Absolute: 0.5 10*3/uL (ref 0.1–1.0)
Monocytes Relative: 11 %
Neutro Abs: 2.2 10*3/uL (ref 1.7–7.7)
Neutrophils Relative %: 51 %
Platelets: 126 10*3/uL — ABNORMAL LOW (ref 150–400)
RBC: 4.11 MIL/uL — ABNORMAL LOW (ref 4.22–5.81)
RDW: 15.7 % — ABNORMAL HIGH (ref 11.5–15.5)
WBC: 4.4 10*3/uL (ref 4.0–10.5)
nRBC: 0 % (ref 0.0–0.2)

## 2022-09-30 LAB — RESP PANEL BY RT-PCR (RSV, FLU A&B, COVID)  RVPGX2
Influenza A by PCR: NEGATIVE
Influenza B by PCR: NEGATIVE
Resp Syncytial Virus by PCR: NEGATIVE
SARS Coronavirus 2 by RT PCR: NEGATIVE

## 2022-09-30 LAB — POC SARS CORONAVIRUS 2 AG: SARSCOV2ONAVIRUS 2 AG: NEGATIVE

## 2022-09-30 LAB — TSH: TSH: 0.59 u[IU]/mL (ref 0.350–4.500)

## 2022-09-30 LAB — ETHANOL: Alcohol, Ethyl (B): 214 mg/dL — ABNORMAL HIGH (ref <10)

## 2022-09-30 MED ORDER — THIAMINE MONONITRATE 100 MG PO TABS: 100.0000 mg | ORAL_TABLET | Freq: Every day | ORAL | Status: DC

## 2022-09-30 MED ORDER — LORAZEPAM 1 MG PO TABS: 1.0000 mg | ORAL_TABLET | Freq: Four times a day (QID) | ORAL | Status: DC | PRN

## 2022-09-30 MED ORDER — ADULT MULTIVITAMIN W/MINERALS CH
1.0000 | ORAL_TABLET | Freq: Every day | ORAL | Status: DC
  Filled 2022-09-30 (×2): qty 1

## 2022-09-30 MED ORDER — THIAMINE MONONITRATE 100 MG PO TABS
100.0000 mg | ORAL_TABLET | Freq: Every day | ORAL | Status: DC
  Administered 2022-10-01 – 2022-10-07 (×7): 100 mg via ORAL
  Filled 2022-09-30 (×7): qty 1

## 2022-09-30 MED ORDER — LORAZEPAM 1 MG PO TABS
1.0000 mg | ORAL_TABLET | Freq: Every day | ORAL | Status: AC
  Administered 2022-10-03: 1 mg via ORAL
  Filled 2022-09-30: qty 1

## 2022-09-30 MED ORDER — THIAMINE HCL 100 MG/ML IJ SOLN: 100.0000 mg | Freq: Once | INTRAMUSCULAR | Status: DC

## 2022-09-30 MED ORDER — MAGNESIUM HYDROXIDE 400 MG/5ML PO SUSP: 30.0000 mL | Freq: Every day | ORAL | Status: DC | PRN

## 2022-09-30 MED ORDER — LORAZEPAM 1 MG PO TABS
1.0000 mg | ORAL_TABLET | Freq: Two times a day (BID) | ORAL | Status: AC
  Administered 2022-10-02 (×2): 1 mg via ORAL
  Filled 2022-09-30 (×2): qty 1

## 2022-09-30 MED ORDER — ACETAMINOPHEN 325 MG PO TABS: 650.0000 mg | ORAL_TABLET | Freq: Four times a day (QID) | ORAL | Status: DC | PRN

## 2022-09-30 MED ORDER — TRAZODONE HCL 50 MG PO TABS
50.0000 mg | ORAL_TABLET | Freq: Every evening | ORAL | Status: DC | PRN
  Administered 2022-09-30 – 2022-10-06 (×7): 50 mg via ORAL
  Filled 2022-09-30 (×7): qty 1

## 2022-09-30 MED ORDER — LORAZEPAM 1 MG PO TABS
1.0000 mg | ORAL_TABLET | Freq: Three times a day (TID) | ORAL | Status: AC
  Administered 2022-10-01 (×3): 1 mg via ORAL
  Filled 2022-09-30 (×3): qty 1

## 2022-09-30 MED ORDER — LOPERAMIDE HCL 2 MG PO CAPS
2.0000 mg | ORAL_CAPSULE | ORAL | Status: AC | PRN
  Administered 2022-10-01: 2 mg via ORAL
  Filled 2022-09-30: qty 1

## 2022-09-30 MED ORDER — ONDANSETRON 4 MG PO TBDP: 4.0000 mg | ORAL_TABLET | Freq: Four times a day (QID) | ORAL | Status: AC | PRN

## 2022-09-30 MED ORDER — ALUM & MAG HYDROXIDE-SIMETH 200-200-20 MG/5ML PO SUSP: 30.0000 mL | ORAL | Status: DC | PRN

## 2022-09-30 MED ORDER — ADULT MULTIVITAMIN W/MINERALS CH
1.0000 | ORAL_TABLET | Freq: Every day | ORAL | Status: DC
  Administered 2022-09-30 – 2022-10-07 (×8): 1 via ORAL
  Filled 2022-09-30 (×7): qty 1

## 2022-09-30 MED ORDER — HYDROXYZINE HCL 25 MG PO TABS: 25.0000 mg | ORAL_TABLET | Freq: Four times a day (QID) | ORAL | Status: AC | PRN

## 2022-09-30 MED ORDER — LORAZEPAM 1 MG PO TABS
1.0000 mg | ORAL_TABLET | Freq: Four times a day (QID) | ORAL | Status: AC
  Administered 2022-09-30 (×3): 1 mg via ORAL
  Filled 2022-09-30 (×3): qty 1

## 2022-09-30 MED ORDER — NICOTINE 21 MG/24HR TD PT24
21.0000 mg | MEDICATED_PATCH | Freq: Every day | TRANSDERMAL | Status: DC
  Administered 2022-09-30 – 2022-10-07 (×7): 21 mg via TRANSDERMAL
  Filled 2022-09-30 (×7): qty 1

## 2022-09-30 MED ORDER — GABAPENTIN 100 MG PO CAPS
200.0000 mg | ORAL_CAPSULE | Freq: Two times a day (BID) | ORAL | Status: DC
  Administered 2022-09-30 – 2022-10-07 (×15): 200 mg via ORAL
  Filled 2022-09-30 (×15): qty 2

## 2022-09-30 MED ORDER — LORAZEPAM 1 MG PO TABS: 1.0000 mg | ORAL_TABLET | Freq: Four times a day (QID) | ORAL | Status: AC | PRN

## 2022-09-30 NOTE — ED Notes (Signed)
Pt admitted to [facility] requesting ETOH detox. Pt states, "I only drank a 40oz this morning but I drank a whole lot more yesterday and the days before that (laughing)". Calm, cooperative throughout interview process. Pt smell of ETOH. Denies SI/HI/AVH. Skin assessment completed. Oriented to unit. Meal and drink offered. Pt verbally contract for safety. Will monitor for safety.

## 2022-09-30 NOTE — ED Notes (Addendum)
Pt sitting in dayroom watching tv. No acute distress noted. No concerns voiced. Informed pt to notify staff with any needs or assistance. Pt verbalized understanding or agreement. Will continue to monitor for safety.

## 2022-09-30 NOTE — Progress Notes (Signed)
   09/30/22 0941  [place] (Walk-ins at [facility] only)  How Did You Hear About [place]? Family/Friend  What Is the Reason for Your Visit/Call Today? Pt is a 57 yo male who presented voluntarily and accompanied by a friend, Amalia Hailey, stating he was intoxicated and wanted help to stop drinking alcohol. Pt denied SI except for occasionally wishing when he went to sleep he would not wake up. Pt reported wanting to hurt others (not kill) at times when he gets angry. Pt denied NSSH, AVH and paranoia. Pt reported daily alcohol use, weekly cannabis use and periodic crack cocaine use whe he is with a particular friend every few months. Pt denies any IP psychiatric hospitalizations.  How Long Has This Been Causing You Problems? > than 6 months  Have You Recently Had Any Thoughts About Hurting Yourself? Yes  How long ago did you have thoughts about hurting yourself? passively  Are You Planning to [place] At This time? Yes (no plans but thinking at times of hurting people at random)  Are You Planning To Harm Someone At This Time? No  Explanation: na  Are you currently experiencing any auditory, visual or other hallucinations? No  Have You Used Any Alcohol or Drugs in the Past 24 Hours? Yes  How long ago did you use Drugs or Alcohol? alcohol this morning  What Did You Use and How Much? 1 40 oz beer today  Do you have any current medical co-morbidities that require immediate attention? No  Clinician description of patient physical appearance/behavior: Pt was calm, cooperative, alert and appeared oriented. Pt did not appear to be responding to internal stimuli, experiencing delusional thinking but did appear to be intoxicated.  Pt's speech and movement appeared slow and unsteady and appearance was disheveled. Pt's mood seemed solemn and somewhat depressed, and pt had a flat affect which was congruent. Pt's judgment and insight seemed impaired.  What Do You Feel Would Help You the Most  Today? Alcohol or Drug Use Treatment  If access to [facility] was not available, would you have sought care in the Emergency Department? Yes  Determination of Need Urgent (48 hours)  Options For Referral Facility-Based Crisis   Laurin Morgenstern T. Mare Ferrari, [place], [facility], [address] [facility] Triage Specialist [facility]

## 2022-09-30 NOTE — ED Notes (Signed)
Pt sitting in dayroom interacting with peers. No acute distress noted. No concerns voiced. Informed pt to notify staff with any needs or assistance. Pt verbalized understanding or agreement. Will continue to monitor for safety.

## 2022-09-30 NOTE — ED Notes (Signed)
Pt is in the bed sleeping. Respirations are even and unlabored. No acute distress noted. Will continue to monitor for safety. 

## 2022-09-30 NOTE — ED Provider Notes (Signed)
Behavioral Health Progress Note  Date and Time: 09/30/2022 12:00 PM Name: Ryan Petty MRN:  [id number]  Subjective:   The patient is a 57 year old male with little past psychiatric history, no documented behavioral health admissions.  He presented to the behavioral urgent care requesting help with alcohol use.  He was admitted to the facility based crisis.  On my evaluation 3/5, the patient appears uncomfortable and is rocking back and forth.  As soon as I sit down he says "can I have something to go to sleep".  The patient says he is exhausted and that only alcohol helps him go to sleep.  He states that he thinks either about his girlfriend who died 1 year ago or "nothing".  He denies experiencing hallucinations.  His only physical complaint is numbness in his feet.  The patient reports that he was in prison for 7.5 years for felony gun possession.  He states that he was released in 2019 and since that time has been living with his mother, who is now dying from cancer.  He states that his girlfriend died 1 year ago and that this was traumatic for him.  Since that time he has had trouble with alcohol use.  He reports that he drinks 2 or 3, 40 ounce beers per day.  His last drink was this morning.  He states that he feels shaky if he does not have a drink of alcohol for an extended period of time.  He denies ever experiencing a seizure or DTs.  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  He states that he occasionally uses crack cocaine or THC.  He states that he has a friend named Elaine/Tara who drove him here to get help.  He also states that he has a new girlfriend who uses crack frequently.  The patient reports experiencing depression and says that he has had suicidal thoughts with no plan.  He contracts for safety.  Diagnosis:  Final diagnoses:  Alcohol abuse with intoxication ([place])     Total Time spent with patient: 20 minutes  Past Psychiatric History: as above Past Medical History: as above Family History: none Family Psychiatric  History: none Social History: as above and per H and P  Additional Social History:  See H and P                  Sleep: Fair  Appetite:  Fair   Current Medications:  Current Facility-Administered Medications  Medication Dose Route Frequency Provider Last Rate Last Admin   acetaminophen (TYLENOL) tablet 650 mg  650 mg Oral Q6H PRN Tharon Aquas, NP       alum & mag hydroxide-simeth (MAALOX/MYLANTA) 200-200-20 MG/5ML suspension 30 mL  30 mL Oral Q4H PRN Tharon Aquas, NP       gabapentin (NEURONTIN) capsule 200 mg  200 mg Oral BID Corky Sox, MD       hydrOXYzine (ATARAX) tablet 25 mg  25 mg Oral Q6H PRN Tharon Aquas, NP       loperamide (IMODIUM) capsule 2-4 mg  2-4 mg Oral PRN Tharon Aquas, NP       LORazepam (ATIVAN) tablet 1 mg  1 mg Oral Q6H PRN Corky Sox, MD       LORazepam (ATIVAN) tablet 1 mg  1 mg Oral QID Corky Sox, MD  Followed by   Derrill Memo ON 10/01/2022] LORazepam (ATIVAN) tablet 1 mg  1 mg Oral TID Corky Sox, MD       Followed by   Derrill Memo ON 10/02/2022] LORazepam (ATIVAN) tablet 1 mg  1 mg Oral BID Corky Sox, MD       Followed by   Derrill Memo ON 10/03/2022] LORazepam (ATIVAN) tablet 1 mg  1 mg Oral Daily Corky Sox, MD       magnesium hydroxide (MILK OF MAGNESIA) suspension 30 mL  30 mL Oral Daily PRN Tharon Aquas, NP       multivitamin with minerals tablet 1 tablet  1 tablet Oral Daily Tharon Aquas, NP       multivitamin with minerals tablet 1 tablet  1 tablet Oral Daily Corky Sox, MD       nicotine (NICODERM CQ - dosed in mg/24 hours) patch 21 mg  21 mg Transdermal Daily Tharon Aquas, NP       ondansetron (ZOFRAN-ODT) disintegrating tablet 4 mg  4 mg Oral Q6H PRN Tharon Aquas, NP       Derrill Memo ON 10/01/2022] thiamine  (VITAMIN B1) tablet 100 mg  100 mg Oral Daily Corky Sox, MD       traZODone (DESYREL) tablet 50 mg  50 mg Oral QHS PRN Corky Sox, MD       No current outpatient medications on file.    Labs  Lab Results:  Admission on 09/19/2022, Discharged on 09/20/2022  Component Date Value Ref Range Status   WBC 09/19/2022 6.3  4.0 - 10.5 K/uL Final   RBC 09/19/2022 4.15 (L)  4.22 - 5.81 MIL/uL Final   Hemoglobin 09/19/2022 14.6  13.0 - 17.0 g/dL Final   HCT 09/19/2022 41.7  39.0 - 52.0 % Final   MCV 09/19/2022 100.5 (H)  80.0 - 100.0 fL Final   MCH 09/19/2022 35.2 (H)  26.0 - 34.0 pg Final   MCHC 09/19/2022 35.0  30.0 - 36.0 g/dL Final   RDW 09/19/2022 14.9  11.5 - 15.5 % Final   Platelets 09/19/2022 96 (L)  150 - 400 K/uL Final   nRBC 09/19/2022 0.0  0.0 - 0.2 % Final   Neutrophils Relative % 09/19/2022 55  % Final   Neutro Abs 09/19/2022 3.5  1.7 - 7.7 K/uL Final   Lymphocytes Relative 09/19/2022 31  % Final   Lymphs Abs 09/19/2022 1.9  0.7 - 4.0 K/uL Final   Monocytes Relative 09/19/2022 11  % Final   Monocytes Absolute 09/19/2022 0.7  0.1 - 1.0 K/uL Final   Eosinophils Relative 09/19/2022 1  % Final   Eosinophils Absolute 09/19/2022 0.1  0.0 - 0.5 K/uL Final   Basophils Relative 09/19/2022 1  % Final   Basophils Absolute 09/19/2022 0.1  0.0 - 0.1 K/uL Final   Immature Granulocytes 09/19/2022 1  % Final   Abs Immature Granulocytes 09/19/2022 0.03  0.00 - 0.07 K/uL Final   Performed at [facility], [place], [place] [address]   Color, Urine 09/19/2022 STRAW (A)  YELLOW Final   APPearance 09/19/2022 CLEAR (A)  CLEAR Final   Specific Gravity, Urine 09/19/2022 1.004 (L)  1.005 - 1.030 Final   pH 09/19/2022 5.0  5.0 - 8.0 Final   Glucose, UA 09/19/2022 NEGATIVE  NEGATIVE mg/dL Final   Hgb urine dipstick 09/19/2022 NEGATIVE  NEGATIVE Final   Bilirubin Urine 09/19/2022 NEGATIVE  NEGATIVE Final   Ketones, ur 09/19/2022 NEGATIVE  NEGATIVE mg/dL Final  Protein, ur 09/19/2022 NEGATIVE  NEGATIVE mg/dL Final   Nitrite 09/19/2022 NEGATIVE  NEGATIVE Final   Leukocytes,Ua 09/19/2022 NEGATIVE  NEGATIVE Final   Performed at [facility], [address] [place], [place] [id number]   Tricyclic, Ur Screen [id number] NONE DETECTED  NONE DETECTED Final   Amphetamines, Ur Screen 09/19/2022 NONE DETECTED  NONE DETECTED Final   MDMA (Ecstasy)Ur Screen 09/19/2022 NONE DETECTED  NONE DETECTED Final   Cocaine Metabolite,Ur [place] 09/19/2022 NONE DETECTED  NONE DETECTED Final   Opiate, Ur Screen 09/19/2022 NONE DETECTED  NONE DETECTED Final   Phencyclidine (PCP) Ur S 09/19/2022 NONE DETECTED  NONE DETECTED Final   Cannabinoid 50 Ng, Ur [place] 09/19/2022 NONE DETECTED  NONE DETECTED Final   Barbiturates, Ur Screen 09/19/2022 NONE DETECTED  NONE DETECTED Final   Benzodiazepine, Ur Scrn 09/19/2022 NONE DETECTED  NONE DETECTED Final   Methadone Scn, Ur 09/19/2022 NONE DETECTED  NONE DETECTED Final   Comment: (NOTE) Tricyclics + metabolites, urine    Cutoff 1000 ng/mL Amphetamines + metabolites, urine  Cutoff 1000 ng/mL MDMA (Ecstasy), urine              Cutoff 500 ng/mL Cocaine Metabolite, urine          Cutoff 300 ng/mL Opiate + metabolites, urine        Cutoff 300 ng/mL Phencyclidine (PCP), urine         Cutoff 25 ng/mL Cannabinoid, urine                 Cutoff 50 ng/mL Barbiturates + metabolites, urine  Cutoff 200 ng/mL Benzodiazepine, urine              Cutoff 200 ng/mL Methadone, urine                   Cutoff 300 ng/mL  The urine drug screen provides only a preliminary, unconfirmed analytical test result and should not be used for non-medical purposes. Clinical consideration and professional judgment should be applied to any positive drug screen result due to possible interfering substances. A more specific alternate chemical method must be used in order to obtain a confirmed analytical result. Gas chromatography / mass spectrometry (GC/MS) is the  preferred confirm                          atory method. Performed at [facility], [address], [place], [place] [address]    Troponin I (High Sensitivity) 09/19/2022 6  <18 ng/L Final   Comment: (NOTE) Elevated high sensitivity troponin I (hsTnI) values and significant  changes across serial measurements may suggest ACS but many other  chronic and acute conditions are known to elevate hsTnI results.  Refer to the Links section for chest pain algorithms and additional  guidance. Performed at [facility], [address] [place], [place] [address]    SARS Coronavirus 2 by RT PCR 09/19/2022 NEGATIVE  NEGATIVE Final   Comment: (NOTE) SARS-CoV-2 target nucleic acids are NOT DETECTED.  The SARS-CoV-2 RNA is generally detectable in upper respiratory specimens during the acute phase of infection. The lowest concentration of SARS-CoV-2 viral copies this assay can detect is 138 copies/mL. A negative result does not preclude SARS-Cov-2 infection and should not be used as the sole basis for treatment or other patient management decisions. A negative result may occur with  improper specimen collection/handling, submission of specimen other than nasopharyngeal swab, presence of viral mutation(s) within the areas  targeted by this assay, and inadequate number of viral copies(<138 copies/mL). A negative result must be combined with clinical observations, patient history, and epidemiological information. The expected result is Negative.  Fact Sheet for Patients:  EntrepreneurPulse.com.au  Fact Sheet for Healthcare Providers:  IncredibleEmployment.be  This test is no                          t yet approved or cleared by the [place] FDA and  has been authorized for detection and/or diagnosis of SARS-CoV-2 by FDA under an Emergency Use Authorization (EUA). This EUA will remain  in effect (meaning this test can be used) for the  duration of the COVID-19 declaration under Section 564(b)(1) of the Act, 21 U.S.C.section 360bbb-3(b)(1), unless the authorization is terminated  or revoked sooner.       Influenza A by PCR 09/19/2022 NEGATIVE  NEGATIVE Final   Influenza B by PCR 09/19/2022 NEGATIVE  NEGATIVE Final   Comment: (NOTE) The Xpert Xpress SARS-CoV-2/FLU/RSV plus assay is intended as an aid in the diagnosis of influenza from Nasopharyngeal swab specimens and should not be used as a sole basis for treatment. Nasal washings and aspirates are unacceptable for Xpert Xpress SARS-CoV-2/FLU/RSV testing.  Fact Sheet for Patients: EntrepreneurPulse.com.au  Fact Sheet for Healthcare Providers: IncredibleEmployment.be  This test is not yet approved or cleared by the [place] FDA and has been authorized for detection and/or diagnosis of SARS-CoV-2 by FDA under an Emergency Use Authorization (EUA). This EUA will remain in effect (meaning this test can be used) for the duration of the COVID-19 declaration under Section 564(b)(1) of the Act, 21 U.S.C. section 360bbb-3(b)(1), unless the authorization is terminated or revoked.     Resp Syncytial Virus by PCR 09/19/2022 NEGATIVE  NEGATIVE Final   Comment: (NOTE) Fact Sheet for Patients: EntrepreneurPulse.com.au  Fact Sheet for Healthcare Providers: IncredibleEmployment.be  This test is not yet approved or cleared by the [place] FDA and has been authorized for detection and/or diagnosis of SARS-CoV-2 by FDA under an Emergency Use Authorization (EUA). This EUA will remain in effect (meaning this test can be used) for the duration of the COVID-19 declaration under Section 564(b)(1) of the Act, 21 U.S.C. section 360bbb-3(b)(1), unless the authorization is terminated or revoked.  Performed at [facility], [address] [place], [place] [address]    Group A Strep by PCR  09/19/2022 NOT DETECTED  NOT DETECTED Final   Performed at [facility], [place], [place] [address]   Sodium 09/19/2022 143  135 - 145 mmol/L Final   Potassium 09/19/2022 3.9  3.5 - 5.1 mmol/L Final   Chloride 09/19/2022 106  98 - 111 mmol/L Final   CO2 09/19/2022 25  22 - 32 mmol/L Final   Glucose, Bld 09/19/2022 83  70 - 99 mg/dL Final   Glucose reference range applies only to samples taken after fasting for at least 8 hours.   BUN 09/19/2022 8  6 - 20 mg/dL Final   Creatinine, Ser 09/19/2022 0.92  0.61 - 1.24 mg/dL Final   Calcium 09/19/2022 7.9 (L)  8.9 - 10.3 mg/dL Final   Total Protein 09/19/2022 7.0  6.5 - 8.1 g/dL Final   Albumin 09/19/2022 3.8  3.5 - 5.0 g/dL Final   AST 09/19/2022 97 (H)  15 - 41 U/L Final   ALT 09/19/2022 48 (H)  0 - 44 U/L Final   Alkaline Phosphatase 09/19/2022 60  38 -  126 U/L Final   Total Bilirubin 09/19/2022 0.9  0.3 - 1.2 mg/dL Final   GFR, Estimated 09/19/2022 >60  >60 mL/min Final   Comment: (NOTE) Calculated using the CKD-EPI Creatinine Equation (2021)    Anion gap 09/19/2022 12  5 - 15 Final   Performed at [facility], [place], [place] [address]   Lipase 09/19/2022 51  11 - 51 U/L Final   Performed at [facility], [place], [place] [address]   Troponin I (High Sensitivity) 09/19/2022 8  <18 ng/L Final   Comment: (NOTE) Elevated high sensitivity troponin I (hsTnI) values and significant  changes across serial measurements may suggest ACS but many other  chronic and acute conditions are known to elevate hsTnI results.  Refer to the "Links" section for chest pain algorithms and additional  guidance. Performed at [facility], [address] [place], [place] [address]   Admission on 09/15/2022, Discharged on 09/15/2022  Component Date Value Ref Range Status   WBC 09/15/2022 4.9  4.0 - 10.5 K/uL Final   RBC 09/15/2022 4.09 (L)  4.22 - 5.81 MIL/uL Final    Hemoglobin 09/15/2022 14.5  13.0 - 17.0 g/dL Final   HCT 09/15/2022 39.9  39.0 - 52.0 % Final   MCV 09/15/2022 97.6  80.0 - 100.0 fL Final   MCH 09/15/2022 35.5 (H)  26.0 - 34.0 pg Final   MCHC 09/15/2022 36.3 (H)  30.0 - 36.0 g/dL Final   RDW 09/15/2022 13.9  11.5 - 15.5 % Final   Platelets 09/15/2022 102 (L)  150 - 400 K/uL Final   nRBC 09/15/2022 0.0  0.0 - 0.2 % Final   Neutrophils Relative % 09/15/2022 37  % Final   Neutro Abs 09/15/2022 1.8  1.7 - 7.7 K/uL Final   Lymphocytes Relative 09/15/2022 49  % Final   Lymphs Abs 09/15/2022 2.4  0.7 - 4.0 K/uL Final   Monocytes Relative 09/15/2022 13  % Final   Monocytes Absolute 09/15/2022 0.6  0.1 - 1.0 K/uL Final   Eosinophils Relative 09/15/2022 0  % Final   Eosinophils Absolute 09/15/2022 0.0  0.0 - 0.5 K/uL Final   Basophils Relative 09/15/2022 1  % Final   Basophils Absolute 09/15/2022 0.1  0.0 - 0.1 K/uL Final   Immature Granulocytes 09/15/2022 0  % Final   Abs Immature Granulocytes 09/15/2022 0.00  0.00 - 0.07 K/uL Final   Performed at [facility], [place], [place] [address]   Sodium 09/15/2022 136  135 - 145 mmol/L Final   Potassium 09/15/2022 3.7  3.5 - 5.1 mmol/L Final   Chloride 09/15/2022 99  98 - 111 mmol/L Final   CO2 09/15/2022 23  22 - 32 mmol/L Final   Glucose, Bld 09/15/2022 107 (H)  70 - 99 mg/dL Final   Glucose reference range applies only to samples taken after fasting for at least 8 hours.   BUN 09/15/2022 7  6 - 20 mg/dL Final   Creatinine, Ser 09/15/2022 0.83  0.61 - 1.24 mg/dL Final   Calcium 09/15/2022 8.5 (L)  8.9 - 10.3 mg/dL Final   Total Protein 09/15/2022 7.7  6.5 - 8.1 g/dL Final   Albumin 09/15/2022 4.1  3.5 - 5.0 g/dL Final   AST 09/15/2022 89 (H)  15 - 41 U/L Final   ALT 09/15/2022 54 (H)  0 - 44 U/L Final   Alkaline Phosphatase 09/15/2022 67  38 - 126 U/L Final  Total Bilirubin 09/15/2022 1.2  0.3 - 1.2 mg/dL Final   GFR, Estimated 09/15/2022 >60  >60 mL/min Final    Comment: (NOTE) Calculated using the CKD-EPI Creatinine Equation (2021)    Anion gap 09/15/2022 14  5 - 15 Final   Performed at [facility], [place], [place] [address]   Alcohol, Ethyl (B) 09/15/2022 313 (HH)  <10 mg/dL Final   Comment: CRITICAL RESULT CALLED TO, READ BACK BY AND VERIFIED WITH AMBER PAYNE AT 1722 ON 09/15/22 BY SS (NOTE) Lowest detectable limit for serum alcohol is 10 mg/dL.  For medical purposes only. Performed at [facility], [place], [place] [address]    Color, Urine 09/15/2022 YELLOW (A)  YELLOW Final   APPearance 09/15/2022 CLEAR (A)  CLEAR Final   Specific Gravity, Urine 09/15/2022 1.005  1.005 - 1.030 Final   pH 09/15/2022 6.0  5.0 - 8.0 Final   Glucose, UA 09/15/2022 NEGATIVE  NEGATIVE mg/dL Final   Hgb urine dipstick 09/15/2022 NEGATIVE  NEGATIVE Final   Bilirubin Urine 09/15/2022 NEGATIVE  NEGATIVE Final   Ketones, ur 09/15/2022 NEGATIVE  NEGATIVE mg/dL Final   Protein, ur 09/15/2022 NEGATIVE  NEGATIVE mg/dL Final   Nitrite 09/15/2022 NEGATIVE  NEGATIVE Final   Leukocytes,Ua 09/15/2022 NEGATIVE  NEGATIVE Final   Performed at [facility], [address] [place], [place] [id number]   Tricyclic, Ur Screen 0000000 NONE DETECTED  NONE DETECTED Final   Amphetamines, Ur Screen 09/15/2022 NONE DETECTED  NONE DETECTED Final   MDMA (Ecstasy)Ur Screen 09/15/2022 NONE DETECTED  NONE DETECTED Final   Cocaine Metabolite,Ur [place] 09/15/2022 NONE DETECTED  NONE DETECTED Final   Opiate, Ur Screen 09/15/2022 NONE DETECTED  NONE DETECTED Final   Phencyclidine (PCP) Ur S 09/15/2022 NONE DETECTED  NONE DETECTED Final   Cannabinoid 50 Ng, Ur [place] 09/15/2022 NONE DETECTED  NONE DETECTED Final   Barbiturates, Ur Screen 09/15/2022 NONE DETECTED  NONE DETECTED Final   Benzodiazepine, Ur Scrn 09/15/2022 NONE DETECTED  NONE DETECTED Final   Methadone Scn, Ur 09/15/2022 NONE DETECTED  NONE DETECTED Final   Comment:  (NOTE) Tricyclics + metabolites, urine    Cutoff 1000 ng/mL Amphetamines + metabolites, urine  Cutoff 1000 ng/mL MDMA (Ecstasy), urine              Cutoff 500 ng/mL Cocaine Metabolite, urine          Cutoff 300 ng/mL Opiate + metabolites, urine        Cutoff 300 ng/mL Phencyclidine (PCP), urine         Cutoff 25 ng/mL Cannabinoid, urine                 Cutoff 50 ng/mL Barbiturates + metabolites, urine  Cutoff 200 ng/mL Benzodiazepine, urine              Cutoff 200 ng/mL Methadone, urine                   Cutoff 300 ng/mL  The urine drug screen provides only a preliminary, unconfirmed analytical test result and should not be used for non-medical purposes. Clinical consideration and professional judgment should be applied to any positive drug screen result due to possible interfering substances. A more specific alternate chemical method must be used in order to obtain a confirmed analytical result. Gas chromatography / mass spectrometry (GC/MS) is the preferred confirm  atory method. Performed at [facility], [address] [place], [place] [address]     Blood Alcohol level:  Lab Results  Component Value Date   [id number] [facility]) 0000000    Metabolic Disorder Labs: No results found for: "HGBA1C", "MPG" No results found for: "PROLACTIN" No results found for: "CHOL", "TRIG", "HDL", "CHOLHDL", "VLDL", "LDLCALC"  Therapeutic Lab Levels: No results found for: "LITHIUM" No results found for: "VALPROATE" No results found for: "CBMZ"  Physical Findings   PHQ2-9    Flowsheet Row ED from 09/30/2022 in [facility]  PHQ-2 Total Score 6  PHQ-9 Total Score 25      [facility] ED from 09/30/2022 in [facility] ED from 09/19/2022 in [facility] Emergency Department at [facility] ED from 09/15/2022 in [facility] Emergency Department at [place] [facility]        Musculoskeletal  Strength & Muscle Tone: within normal limits Gait & Station: normal Patient leans: N/A  Psychiatric Specialty Exam  Presentation General Appearance: Appropriate for Environment  Eye Contact:Fair  Speech:Clear and Coherent  Speech Volume:Normal  Handedness:-- (not assessed)   Mood and Affect  Mood:Euthymic  Affect:Congruent   Thought Process  Thought Processes:Coherent; Linear  Descriptions of Associations:Intact  Orientation:Full (Time, Place and Person)  Thought Content:Logical    Hallucinations:Hallucinations: None  Ideas of Reference:None  Suicidal Thoughts:Suicidal Thoughts: No  Homicidal Thoughts:Homicidal Thoughts: No   Sensorium  Memory:Immediate Fair; Recent Fair; Remote Fair  Judgment:Fair  Insight:Fair   Executive Functions  Concentration:Fair  Attention Span:Fair  [place]   Psychomotor Activity  Psychomotor Activity:Psychomotor Activity: Normal   Assets  Assets:Communication Skills; Resilience   Sleep  Sleep:Sleep: Fair   Nutritional Assessment (For OBS and FBC admissions only) Has the patient had a weight loss or gain of 10 pounds or more in the last 3 months?: No Has the patient had a decrease in food intake/or appetite?: Yes Does the patient have dental problems?: No Does the patient have eating habits or behaviors that may be indicators of an eating disorder including binging or inducing vomiting?: No Has the patient recently lost weight without trying?: 0 Has the patient been eating poorly because of a decreased appetite?: 0 Malnutrition Screening Tool Score: 0    Physical Exam Constitutional:      Appearance: the patient is not toxic-appearing.  Pulmonary:     Effort: Pulmonary effort is normal.  Neurological:     General: No focal deficit present.     Mental Status: the patient is alert and oriented to person, place, and  time.   Review of Systems  Respiratory:  Negative for shortness of breath.   Cardiovascular:  Negative for chest pain.  Gastrointestinal:  Negative for abdominal pain, constipation, diarrhea, nausea and vomiting.  Neurological:  Negative for headaches.    BP (!) 149/96 (BP Location: Left Arm) Comment: Siera, RN notified  Pulse 100   Temp 98.7 F (37.1 C) (Oral)   Resp 20   SpO2 98%   Assessment and Plan:  Status: Voluntary, no acute safety concerns given lack of previous suicidality and patient contracting for safety  Alcohol use disorder, severe, active withdrawal, no history of severe withdrawal - Ativan taper with gabapentin, CIWA scoring with as needed Ativan - As needed trazodone for sleep - Patient will engage in detox and consider residential rehab  Medical: - Routine labs -  Thiamine - Hypertension and tachycardia, suspect secondary to withdrawal    Corky Sox, MD 09/30/2022 12:00 PM

## 2022-09-30 NOTE — BH Assessment (Addendum)
Comprehensive Clinical Assessment (CCA) Note  09/30/2022 CAMARA BATTISTINI [id number]  DISPOSITION: Per Elvin So NP, pt is recommended for detox from alcohol in [facility] at [facility].   The patient demonstrates the following risk factors for suicide: Chronic risk factors for suicide include: substance use disorder. Acute risk factors for suicide include: social withdrawal/isolation and loss (financial, interpersonal, professional). Protective factors for this patient include: positive social support and hope for the future. Considering these factors, the overall suicide risk at this point appears to be low. Patient is appropriate for outpatient follow up.   Pt is a 57 yo male who presented voluntarily and accompanied by a friend, Amalia Hailey, stating he was intoxicated and wanted help to stop drinking alcohol. Pt denied SI except for occasionally wishing when he went to sleep he would not wake up. Pt reported wanting to hurt others (not kill) at times when he gets angry. Pt denied NSSH, AVH and paranoia. Pt reported daily alcohol use, weekly cannabis use and periodic crack cocaine use whe he is with a particular friend every few months. Pt denies any IP psychiatric hospitalizations.  Pt currently lives with his mother who he says is ill with Stage 4 cancer. Pt reported he is unemployed and does not receive disability income. Pt reported that he spent about 7-8 years in prison for an unknown crime (2010-2019) and has now fulfilled probation. Pt reported that he has never married but has 2 adult children. Pt reported he sees one child periodically and does not see the other. Pt stated he has no current OP psychiatric providers and has never been psychiatrically admitted.   Pt was calm, cooperative, alert and appeared oriented. Pt did not appear to be responding to internal stimuli, experiencing delusional thinking but did appear to be intoxicated. Pt's speech and movement appeared slow and unsteady and  appearance was disheveled. Pt's mood seemed solemn and somewhat depressed, and pt had a flat affect which was congruent. Pt's judgment and insight seemed impaired.   Chief Complaint: No chief complaint on file.  Visit Diagnosis:  Alcohol Dependence, Severe Cannabis use d/o    CCA Screening, Triage and Referral (STR)  Patient Reported Information How did you hear about [place]? Family/Friend  What Is the Reason for Your Visit/Call Today? Pt is a 57 yo male who presented voluntarily and accompanied by a friend, Amalia Hailey, stating he was intoxicated and wanted help to stop drinking alcohol. Pt denied SI except for occasionally wishing when he went to sleep he would not wake up. Pt reported wanting to hurt others (not kill) at times when he gets angry. Pt denied NSSH, AVH and paranoia. Pt reported daily alcohol use, weekly cannabis use and periodic crack cocaine use whe he is with a particular friend every few months. Pt denies any IP psychiatric hospitalizations.  How Long Has This Been Causing You Problems? > than 6 months  What Do You Feel Would Help You the Most Today? Alcohol or Drug Use Treatment   Have You Recently Had Any Thoughts About Hurting Yourself? Yes  Are You Planning to Commit Suicide/Harm Yourself At This time? Yes (no plans but thinking at times of hurting people at random)   [facility] ED from 09/30/2022 in [facility] ED from 09/19/2022 in [facility] Emergency Department at [facility] ED from 09/15/2022 in [facility] Emergency Department at [place] No Risk       Have you  Recently Had Thoughts About [place]? No  Are You Planning to Harm Someone at This Time? No  Explanation: na   Have You Used Any Alcohol or Drugs in the Past 24 Hours? Yes  What Did You Use and How Much? 1 40 oz beer today   Do You Currently Have a Therapist/Psychiatrist? No  Name of  Therapist/Psychiatrist: Name of Therapist/Psychiatrist: na   Have You Been Recently Discharged From Any Office Practice or Programs? Yes (stated was recently discharged from [facility])  Explanation of Discharge From Practice/Program: unknown     CCA Screening Triage Referral Assessment Type of Contact: Face-to-Face  Telemedicine Service Delivery:   Is this Initial or Reassessment?   Date Telepsych consult ordered in [facility]:    Time Telepsych consult ordered in [facility]:    Location of Assessment: [facility] Assessment Services  Provider Location: [facility] Assessment Services   Collateral Involvement: Amalia Hailey, friend. Pt gave verbal permission for her to be present and participate.   Does Patient Have a Stage manager Guardian? No  Legal Guardian Contact Information: na  Copy of Legal Guardianship Form: No - copy requested  Legal Guardian Notified of Arrival: -- (na)  Legal Guardian Notified of Pending Discharge: -- (na)  If Minor and Not Living with Parent(s), Who has Custody? na  Is CPS involved or ever been involved? Never (none reported)  Is APS involved or ever been involved? Never (none reported)   Patient Determined To Be At Risk for Harm To Self or Others Based on Review of Patient Reported Information or Presenting Complaint? No  Method: No Plan  Availability of Means: No access or NA  Intent: Vague intent or NA  Notification Required: No need or identified person  Additional Information for Danger to Others Potential: -- (n/a)  Additional Comments for Danger to Others Potential: na  Are There Guns or Other Weapons in Your Home? No  Types of Guns/Weapons: na  Are These Weapons Safely Secured?                            -- (na)  Who Could Verify You Are Able To Have These Secured: na  Do You Have any Outstanding Charges, Pending Court Dates, Parole/Probation? none reported  Contacted To Inform of Risk of Harm To Self or Others: -- (n/a)    Does  Patient Present under Involuntary Commitment? No    [place] of Residence: [place]   Patient Currently Receiving the Following Services: Not Receiving Services   Determination of Need: Urgent (48 hours)   Options For Referral: Facility-Based Crisis     CCA Biopsychosocial Patient Reported Schizophrenia/Schizoaffective Diagnosis in Past: No   Strengths: Pt has stable housing; pt is able to ask for help; pt is receptive to recieving treatment.   Mental Health Symptoms Depression:   Hopelessness; Sleep (too much or little); Change in energy/activity; Difficulty Concentrating; Tearfulness   Duration of Depressive symptoms:  Duration of Depressive Symptoms: Greater than two weeks   Mania:   None   Anxiety:    Sleep; Tension; Worrying   Psychosis:   None   Duration of Psychotic symptoms:  Duration of Psychotic Symptoms: -- (na)   Trauma:   None (none reported)   Obsessions:   Intrusive/time consuming; Good insight; Disrupts routine/functioning; Cause anxiety; Recurrent & persistent thoughts/impulses/images   Compulsions:   Good insight; "Driven" to perform behaviors/acts; Disrupts with routine/functioning; Intended to reduce stress or prevent  another outcome; Intrusive/time consuming; Repeated behaviors/mental acts   Inattention:   N/A   Hyperactivity/Impulsivity:   Feeling of restlessness   Oppositional/Defiant Behaviors:   N/A   Emotional Irregularity:   Chronic feelings of emptiness; Mood lability   Other Mood/Personality Symptoms:   Pt expressed feelings of grief about a deceased girlfriend.    Mental Status Exam Appearance and self-care  Stature:   Average   Weight:   Thin   Clothing:   Dirty; Disheveled   Grooming:   Neglected   Cosmetic use:   None   Posture/gait:   Slumped   Motor activity:   Slowed; Tremor   Sensorium  Attention:   Distractible   Concentration:   Anxiety interferes   Orientation:   Person; Place;  Situation; Object; Time; X5   Recall/memory:   Normal   Affect and Mood  Affect:   Depressed; Flat   Mood:   Depressed   Relating  Eye contact:   Normal   Facial expression:   Sad   Attitude toward examiner:   Cooperative   Thought and Language  Speech flow:  Slurred; Slow; Soft   Thought content:   Appropriate to Mood and Circumstances   Preoccupation:   Ruminations (related to deceased GF)   Hallucinations:   None   Organization:   Coherent   [employer] of Knowledge:   Good   Intelligence:   Average   Abstraction:   Normal   Judgement:   Poor   Reality Testing:   Adequate   Insight:   Shallow; Present   Decision Making:   Impulsive   Social Functioning  Social Maturity:   Isolates   Social Judgement:   "Games developer"; Heedless   Stress  Stressors:   Grief/losses   Coping Ability:   Exhausted   Skill Deficits:   Self-care; Self-control   Supports:   Support needed     Religion: Religion/Spirituality Are You A Religious Person?: Yes How Might This Affect Treatment?: n/a  Leisure/Recreation: Leisure / Recreation Do You Have Hobbies?: No  Exercise/Diet: Exercise/Diet Do You Exercise?: Yes What Type of Exercise Do You Do?: Run/Walk How Many Times a Week Do You Exercise?: 4-5 times a week Have You Gained or Lost A Significant Amount of Weight in the Past Six Months?: No Do You Follow a Special Diet?: No Do You Have Any Trouble Sleeping?: Yes Explanation of Sleeping Difficulties: Pt reported that he cannot sleep for days at a time.   CCA Employment/Education Employment/Work Situation: Employment / Work Situation Employment Situation: Unemployed Patient's Job has Been Impacted by Current Illness: No Has Patient ever Been in Passenger transport manager?: No  Education: Education Is Patient Currently Attending School?: No Last Grade Completed:  (GED) Did You Attend College?: No Did You Have An Individualized  Education Program (IIEP): No Did You Have Any Difficulty At School?: No Patient's Education Has Been Impacted by Current Illness: No   CCA Family/Childhood History Family and Relationship History: Family history Marital status: Single Does patient have children?: Yes How many children?: 2 (Pt reported that he has one adult child he has contact with occasionally and another he does not see.) How is patient's relationship with their children?: Pt reported that he does not have a relationship with daughter.  Childhood History:  Childhood History By whom was/is the patient raised?: Mother Did patient suffer any verbal/emotional/physical/sexual abuse as a child?: No Did patient suffer from severe childhood neglect?: No Has patient ever been sexually  abused/assaulted/raped as an adolescent or adult?: No Was the patient ever a victim of a crime or a disaster?: No Witnessed domestic violence?: No Has patient been affected by domestic violence as an adult?: No       CCA Substance Use Alcohol/Drug Use: Alcohol / Drug Use Pain Medications: See MAR Prescriptions: See MAR Over the Counter: See MAR History of alcohol / drug use?: Yes Longest period of sobriety (when/how long): 2 years Negative Consequences of Use: Personal relationships, Financial Withdrawal Symptoms: Tremors Substance #1 Name of Substance 1: alcohol 1 - Age of First Use: 7 1 - Amount (size/oz): minimum 2-3 40 oz beers 1 - Frequency: daily 1 - Duration: ongoing 1 - Last Use / Amount: 09/30/22 today 1 - Method of Aquiring: purchase 1- Route of Use: oral Substance #2 Name of Substance 2: cannabis 2 - Age of First Use[age] 2 - Amount (size/oz): 1 joint shared 2 - Frequency: weekly 2 - Duration: ongoing 2 - Last Use / Amount: 2-3 weeks ago 2 - Method of Aquiring: unknown 2 - Route of Substance Use: smoke Substance #3 Name of Substance 3: crack cocaine 3 - Age of First Use: teens 3 - Amount (size/oz): unknown  amount shared 3 - Frequency: "every few months" 3 - Duration: ongoing 3 - Last Use / Amount: 1 month ago 3 - Method of Aquiring: unknown 3 - Route of Substance Use: smoke                   ASAM's:  Six Dimensions of Multidimensional Assessment  Dimension 1:  Acute Intoxication and/or Withdrawal Potential:   Dimension 1:  Description of individual's past and current experiences of substance use and withdrawal: Pt has a hx of chronic alcohol abuse  Dimension 2:  Biomedical Conditions and Complications:   Dimension 2:  Description of patient's biomedical conditions and  complications: Pt has a hep C Dx  Dimension 3:  Emotional, Behavioral, or Cognitive Conditions and Complications:  Dimension 3:  Description of emotional, behavioral, or cognitive conditions and complications: Pt has prolonged grief  Dimension 4:  Readiness to Change:     Dimension 5:  Relapse, Continued use, or Continued Problem Potential:     Dimension 6:  Recovery/Living Environment:     ASAM Severity Score: ASAM's Severity Rating Score: 16  ASAM Recommended Level of Treatment: ASAM Recommended Level of Treatment: Level III Residential Treatment   Substance use Disorder (SUD) Substance Use Disorder (SUD)  Checklist Symptoms of Substance Use: Continued use despite having a persistent/recurrent physical/psychological problem caused/exacerbated by use, Continued use despite persistent or recurrent social, interpersonal problems, caused or exacerbated by use, Evidence of tolerance  Recommendations for Services/Supports/Treatments: Recommendations for Services/Supports/Treatments Recommendations For Services/Supports/Treatments: Inpatient Hospitalization, Detox, Individual Therapy, SAIOP (Substance Abuse Intensive Outpatient Program)  Discharge Disposition:    DSM5 Diagnoses: Patient Active Problem List   Diagnosis Date Noted   ETOH abuse 09/20/2022   Head injury 09/20/2022   MDD (major depressive disorder),  recurrent episode, moderate ([place]) 09/19/2022   Alcohol abuse with intoxication ([place]) 09/19/2022   Supraglottitis 11/12/2021   Laryngitis, acute 11/12/2021   Nicotine dependence 11/12/2021   Hepatitis C without hepatic coma 01/13/2019     Referrals to Alternative Service(s): Referred to Alternative Service(s):   Place:   Date:   Time:    Referred to Alternative Service(s):   Place:   Date:   Time:    Referred to Alternative Service(s):   Place:   Date:  Time:    Referred to Alternative Service(s):   Place:   Date:   Time:     Leng Montesdeoca T, Counselor

## 2022-09-30 NOTE — ED Provider Notes (Signed)
Facility Based Crisis Admission H&P  Date: 09/30/22 Patient Name: Ryan Petty MRN: [id number] Chief Complaint: "I was in the [facility] last week. They sent me here for detox and I did not stay. I was ill prepared. I had no kind of clothes or anything."  Diagnoses:  Final diagnoses:  Alcohol abuse with intoxication ([place])   HPI:   Pt presents voluntarily to [facility] behavioral health for walk-in assessment. Pt is assessed face-to-face by nurse practitioner.   Ryan Petty, 57 y.o., male patient seen face to face by this provider; and chart reviewed on 09/30/22.  On evaluation, when asked reason for presenting today, Ryan Petty reports "I was in [facility] last week. They sent me here for detox and I did not stay. I was ill prepared. I had no kind of clothes or anything." Pt reports he is interested in detox from alcohol.  Pt reports euthymic mood. He reports baseline poor appetite, denies appetite changes. He reports poor sleep, sleeping 3 hours/night.   Pt denies suicidal, homicidal or violent ideations. He denies auditory visual hallucinations or paranoia.  Pt reports alcohol use daily. Reports his last use of alcohol was 1 40oz this morning. He reports he drinks "whatever I can afford". He reports he drinks between 1 to 4 40oz beers/day. Pt reports use of crack/cocaine occasionally. He reports last use of crack/cocaine was at least over 1 month ago. Pt reports use of marijuana occasionally. He reports last use of marijuana was 2 or 3 weeks ago. Pt reports daily use of cigarettes, 1 ppd. He reports last cigarette was couple hours ago. Pt denies use of methamphetamines, heroin, fentanyl, other substances. Pt denies current withdrawal symptoms. He denies history of delirium tremens or withdrawal seizures.  Pt denies history of suicide attempt, inpatient psychiatric hospitalization or suicide attempt.  Pt denies taking daily medications.    Pt denies knowledge of family medical or psychiatric history.  Pt reports he is unemployed. He reports he is living with his mother.  Pt denies access to a firearm or other weapon.  Pt reports pending court date on 5/21 for DUI. He reports he got pulled over for a DUI 3 to 4 weeks ago. He reports he was previously in prison for felony possession of a firearm. He reports he was in prison for 7.5 years and was discharged in 2019.   PHQ 2-9:  [place] ED from 09/30/2022 in [facility]  Thoughts that you would be better off dead, or of hurting yourself in some way More than half the days  PHQ-9 Total Score 25       [place] ED from 09/30/2022 in [facility] ED from 09/19/2022 in [facility] Emergency Department at [facility] ED from 09/15/2022 in [facility] Emergency Department at [place] No Risk        Total Time spent with patient: 45 minutes  Musculoskeletal  Strength & Muscle Tone: within normal limits Gait & Station: normal Patient leans: N/A  Psychiatric Specialty Exam  Presentation General Appearance:  Disheveled  Eye Contact: Fair  Speech: Clear and Coherent; Normal Rate  Speech Volume: Normal  Handedness: Right   Mood and Affect  Mood: Euthymic  Affect: Full Range   Thought Process  Thought Processes: Coherent  Descriptions of Associations:Intact  Orientation:Full (Time, Place and Person)  Thought Content:Logical  Diagnosis of Schizophrenia or Schizoaffective disorder  in past: No   Hallucinations:Hallucinations: None  Ideas of Reference:None  Suicidal Thoughts:Suicidal Thoughts: No  Homicidal Thoughts:Homicidal Thoughts: No   Sensorium  Memory: Immediate Good  Judgment: Intact  Insight: Present   Executive Functions  Concentration: Fair  Attention Span: Fair  Recall: [place] of  Knowledge: Good  Language: Good   Psychomotor Activity  Psychomotor Activity:Psychomotor Activity: Restlessness   Assets  Assets: Communication Skills; Desire for Improvement; Financial Resources/Insurance; Housing; Resilience; Social Support   Sleep  Sleep:Sleep: Poor   Nutritional Assessment (For OBS and FBC admissions only) Has the patient had a weight loss or gain of 10 pounds or more in the last 3 months?: -- (pt is unsure) Has the patient had a decrease in food intake/or appetite?: -- (pt reports baseline poor appetite, denies appetite changes) Does the patient have dental problems?: -- (pt has missing dentition) Does the patient have eating habits or behaviors that may be indicators of an eating disorder including binging or inducing vomiting?: No Has the patient recently lost weight without trying?: 2.0 Has the patient been eating poorly because of a decreased appetite?: 1 Malnutrition Screening Tool Score: 3    Physical Exam Constitutional:      General: He is not in acute distress.    Appearance: He is not ill-appearing, toxic-appearing or diaphoretic.  Cardiovascular:     Rate and Rhythm: Normal rate.  Pulmonary:     Effort: Pulmonary effort is normal. No respiratory distress.  Skin:    General: Skin is warm and dry.  Neurological:     Mental Status: He is alert and oriented to person, place, and time.  Psychiatric:        Attention and Perception: Attention and perception normal.        Mood and Affect: Mood and affect normal.        Speech: Speech normal.        Behavior: Behavior normal. Behavior is cooperative.        Thought Content: Thought content normal.        Cognition and Memory: Cognition and memory normal.    Review of Systems  Constitutional:  Negative for chills and fever.  Respiratory:  Negative for shortness of breath.   Cardiovascular:  Negative for chest pain and palpitations.  Gastrointestinal:  Negative for abdominal pain, nausea  and vomiting.  Neurological:  Negative for seizures, loss of consciousness and headaches.  Psychiatric/Behavioral:  Positive for substance abuse.     Blood pressure (!) 149/96, pulse 100, temperature 98.7 F (37.1 C), temperature source Oral, resp. rate 20, SpO2 98 %. There is no height or weight on file to calculate BMI.  Past Psychiatric History: MDD, ETOH abuse, Nicotine dependence   Is the patient at risk to self? No  Has the patient been a risk to self in the past 6 months? No .    Has the patient been a risk to self within the distant past? No   Is the patient a risk to others? No   Has the patient been a risk to others in the past 6 months? No   Has the patient been a risk to others within the distant past? No   Past Medical History: Supraglottitis, Hepatitis C without hepatic coma  Family History: None reported Social History: Unemployed, living with mother  Last Labs:  Admission on 09/19/2022, Discharged on 09/20/2022  Component Date Value Ref Range Status   WBC 09/19/2022 6.3  4.0 - 10.5 K/uL Final  RBC 09/19/2022 4.15 (L)  4.22 - 5.81 MIL/uL Final   Hemoglobin 09/19/2022 14.6  13.0 - 17.0 g/dL Final   HCT 09/19/2022 41.7  39.0 - 52.0 % Final   MCV 09/19/2022 100.5 (H)  80.0 - 100.0 fL Final   MCH 09/19/2022 35.2 (H)  26.0 - 34.0 pg Final   MCHC 09/19/2022 35.0  30.0 - 36.0 g/dL Final   RDW 09/19/2022 14.9  11.5 - 15.5 % Final   Platelets 09/19/2022 96 (L)  150 - 400 K/uL Final   nRBC 09/19/2022 0.0  0.0 - 0.2 % Final   Neutrophils Relative % 09/19/2022 55  % Final   Neutro Abs 09/19/2022 3.5  1.7 - 7.7 K/uL Final   Lymphocytes Relative 09/19/2022 31  % Final   Lymphs Abs 09/19/2022 1.9  0.7 - 4.0 K/uL Final   Monocytes Relative 09/19/2022 11  % Final   Monocytes Absolute 09/19/2022 0.7  0.1 - 1.0 K/uL Final   Eosinophils Relative 09/19/2022 1  % Final   Eosinophils Absolute 09/19/2022 0.1  0.0 - 0.5 K/uL Final   Basophils Relative 09/19/2022 1  % Final    Basophils Absolute 09/19/2022 0.1  0.0 - 0.1 K/uL Final   Immature Granulocytes 09/19/2022 1  % Final   Abs Immature Granulocytes 09/19/2022 0.03  0.00 - 0.07 K/uL Final   Performed at [facility], [address] [place], [place] [address]   Color, Urine 09/19/2022 STRAW (A)  YELLOW Final   APPearance 09/19/2022 CLEAR (A)  CLEAR Final   Specific Gravity, Urine 09/19/2022 1.004 (L)  1.005 - 1.030 Final   pH 09/19/2022 5.0  5.0 - 8.0 Final   Glucose, UA 09/19/2022 NEGATIVE  NEGATIVE mg/dL Final   Hgb urine dipstick 09/19/2022 NEGATIVE  NEGATIVE Final   Bilirubin Urine 09/19/2022 NEGATIVE  NEGATIVE Final   Ketones, ur 09/19/2022 NEGATIVE  NEGATIVE mg/dL Final   Protein, ur 09/19/2022 NEGATIVE  NEGATIVE mg/dL Final   Nitrite 09/19/2022 NEGATIVE  NEGATIVE Final   Leukocytes,Ua 09/19/2022 NEGATIVE  NEGATIVE Final   Performed at [facility], [address] [place],  [id number]   Tricyclic, Ur Screen [id number] NONE DETECTED  NONE DETECTED Final   Amphetamines, Ur Screen 09/19/2022 NONE DETECTED  NONE DETECTED Final   MDMA (Ecstasy)Ur Screen 09/19/2022 NONE DETECTED  NONE DETECTED Final   Cocaine Metabolite,Ur [place] 09/19/2022 NONE DETECTED  NONE DETECTED Final   Opiate, Ur Screen 09/19/2022 NONE DETECTED  NONE DETECTED Final   Phencyclidine (PCP) Ur S 09/19/2022 NONE DETECTED  NONE DETECTED Final   Cannabinoid 50 Ng, Ur [place] 09/19/2022 NONE DETECTED  NONE DETECTED Final   Barbiturates, Ur Screen 09/19/2022 NONE DETECTED  NONE DETECTED Final   Benzodiazepine, Ur Scrn 09/19/2022 NONE DETECTED  NONE DETECTED Final   Methadone Scn, Ur 09/19/2022 NONE DETECTED  NONE DETECTED Final   Comment: (NOTE) Tricyclics + metabolites, urine    Cutoff 1000 ng/mL Amphetamines + metabolites, urine  Cutoff 1000 ng/mL MDMA (Ecstasy), urine              Cutoff 500 ng/mL Cocaine Metabolite, urine          Cutoff 300 ng/mL Opiate + metabolites, urine        Cutoff 300 ng/mL Phencyclidine  (PCP), urine         Cutoff 25 ng/mL Cannabinoid, urine                 Cutoff 50 ng/mL Barbiturates + metabolites, urine  Cutoff  200 ng/mL Benzodiazepine, urine              Cutoff 200 ng/mL Methadone, urine                   Cutoff 300 ng/mL  The urine drug screen provides only a preliminary, unconfirmed analytical test result and should not be used for non-medical purposes. Clinical consideration and professional judgment should be applied to any positive drug screen result due to possible interfering substances. A more specific alternate chemical method must be used in order to obtain a confirmed analytical result. Gas chromatography / mass spectrometry (GC/MS) is the preferred confirm                          atory method. Performed at [facility], [place], [place] [address]    Troponin I (High Sensitivity) 09/19/2022 6  <18 ng/L Final   Comment: (NOTE) Elevated high sensitivity troponin I (hsTnI) values and significant  changes across serial measurements may suggest ACS but many other  chronic and acute conditions are known to elevate hsTnI results.  Refer to the Links section for chest pain algorithms and additional  guidance. Performed at [facility], [address] [place], [place] [address]    SARS Coronavirus 2 by RT PCR 09/19/2022 NEGATIVE  NEGATIVE Final   Comment: (NOTE) SARS-CoV-2 target nucleic acids are NOT DETECTED.  The SARS-CoV-2 RNA is generally detectable in upper respiratory specimens during the acute phase of infection. The lowest concentration of SARS-CoV-2 viral copies this assay can detect is 138 copies/mL. A negative result does not preclude SARS-Cov-2 infection and should not be used as the sole basis for treatment or other patient management decisions. A negative result may occur with  improper specimen collection/handling, submission of specimen other than nasopharyngeal swab, presence of viral mutation(s)  within the areas targeted by this assay, and inadequate number of viral copies(<138 copies/mL). A negative result must be combined with clinical observations, patient history, and epidemiological information. The expected result is Negative.  Fact Sheet for Patients:  EntrepreneurPulse.com.au  Fact Sheet for Healthcare Providers:  IncredibleEmployment.be  This test is no                          t yet approved or cleared by the [place] FDA and  has been authorized for detection and/or diagnosis of SARS-CoV-2 by FDA under an Emergency Use Authorization (EUA). This EUA will remain  in effect (meaning this test can be used) for the duration of the COVID-19 declaration under Section 564(b)(1) of the Act, 21 U.S.C.section 360bbb-3(b)(1), unless the authorization is terminated  or revoked sooner.       Influenza A by PCR 09/19/2022 NEGATIVE  NEGATIVE Final   Influenza B by PCR 09/19/2022 NEGATIVE  NEGATIVE Final   Comment: (NOTE) The Xpert Xpress SARS-CoV-2/FLU/RSV plus assay is intended as an aid in the diagnosis of influenza from Nasopharyngeal swab specimens and should not be used as a sole basis for treatment. Nasal washings and aspirates are unacceptable for Xpert Xpress SARS-CoV-2/FLU/RSV testing.  Fact Sheet for Patients: EntrepreneurPulse.com.au  Fact Sheet for Healthcare Providers: IncredibleEmployment.be  This test is not yet approved or cleared by the [place] FDA and has been authorized for detection and/or diagnosis of SARS-CoV-2 by FDA under an Emergency Use Authorization (EUA). This EUA will remain in effect (meaning this test can be used) for  the duration of the COVID-19 declaration under Section 564(b)(1) of the Act, 21 U.S.C. section 360bbb-3(b)(1), unless the authorization is terminated or revoked.     Resp Syncytial Virus by PCR 09/19/2022 NEGATIVE  NEGATIVE Final   Comment:  (NOTE) Fact Sheet for Patients: EntrepreneurPulse.com.au  Fact Sheet for Healthcare Providers: IncredibleEmployment.be  This test is not yet approved or cleared by the [place] FDA and has been authorized for detection and/or diagnosis of SARS-CoV-2 by FDA under an Emergency Use Authorization (EUA). This EUA will remain in effect (meaning this test can be used) for the duration of the COVID-19 declaration under Section 564(b)(1) of the Act, 21 U.S.C. section 360bbb-3(b)(1), unless the authorization is terminated or revoked.  Performed at [facility], [address] [place], [place] [address]    Group A Strep by PCR 09/19/2022 NOT DETECTED  NOT DETECTED Final   Performed at [facility], [place], [place] [address]   Sodium 09/19/2022 143  135 - 145 mmol/L Final   Potassium 09/19/2022 3.9  3.5 - 5.1 mmol/L Final   Chloride 09/19/2022 106  98 - 111 mmol/L Final   CO2 09/19/2022 25  22 - 32 mmol/L Final   Glucose, Bld 09/19/2022 83  70 - 99 mg/dL Final   Glucose reference range applies only to samples taken after fasting for at least 8 hours.   BUN 09/19/2022 8  6 - 20 mg/dL Final   Creatinine, Ser 09/19/2022 0.92  0.61 - 1.24 mg/dL Final   Calcium 09/19/2022 7.9 (L)  8.9 - 10.3 mg/dL Final   Total Protein 09/19/2022 7.0  6.5 - 8.1 g/dL Final   Albumin 09/19/2022 3.8  3.5 - 5.0 g/dL Final   AST 09/19/2022 97 (H)  15 - 41 U/L Final   ALT 09/19/2022 48 (H)  0 - 44 U/L Final   Alkaline Phosphatase 09/19/2022 60  38 - 126 U/L Final   Total Bilirubin 09/19/2022 0.9  0.3 - 1.2 mg/dL Final   GFR, Estimated 09/19/2022 >60  >60 mL/min Final   Comment: (NOTE) Calculated using the CKD-EPI Creatinine Equation (2021)    Anion gap 09/19/2022 12  5 - 15 Final   Performed at [facility], [address] [place], [place] [address]   Lipase 09/19/2022 51  11 - 51 U/L Final   Performed at [facility], [address] [place], [place] [address]   Troponin I (High Sensitivity) 09/19/2022 8  <18 ng/L Final   Comment: (NOTE) Elevated high sensitivity troponin I (hsTnI) values and significant  changes across serial measurements may suggest ACS but many other  chronic and acute conditions are known to elevate hsTnI results.  Refer to the "Links" section for chest pain algorithms and additional  guidance. Performed at [facility], [address] [place], [place] [address]   Admission on 09/15/2022, Discharged on 09/15/2022  Component Date Value Ref Range Status   WBC 09/15/2022 4.9  4.0 - 10.5 K/uL Final   RBC 09/15/2022 4.09 (L)  4.22 - 5.81 MIL/uL Final   Hemoglobin 09/15/2022 14.5  13.0 - 17.0 g/dL Final   HCT 09/15/2022 39.9  39.0 - 52.0 % Final   MCV 09/15/2022 97.6  80.0 - 100.0 fL Final   MCH 09/15/2022 35.5 (H)  26.0 - 34.0 pg Final   MCHC 09/15/2022 36.3 (H)  30.0 - 36.0 g/dL Final   RDW 09/15/2022 13.9  11.5 - 15.5 % Final   Platelets 09/15/2022 102 (  L)  150 - 400 K/uL Final   nRBC 09/15/2022 0.0  0.0 - 0.2 % Final   Neutrophils Relative % 09/15/2022 37  % Final   Neutro Abs 09/15/2022 1.8  1.7 - 7.7 K/uL Final   Lymphocytes Relative 09/15/2022 49  % Final   Lymphs Abs 09/15/2022 2.4  0.7 - 4.0 K/uL Final   Monocytes Relative 09/15/2022 13  % Final   Monocytes Absolute 09/15/2022 0.6  0.1 - 1.0 K/uL Final   Eosinophils Relative 09/15/2022 0  % Final   Eosinophils Absolute 09/15/2022 0.0  0.0 - 0.5 K/uL Final   Basophils Relative 09/15/2022 1  % Final   Basophils Absolute 09/15/2022 0.1  0.0 - 0.1 K/uL Final   Immature Granulocytes 09/15/2022 0  % Final   Abs Immature Granulocytes 09/15/2022 0.00  0.00 - 0.07 K/uL Final   Performed at [facility], [place], [place] [address]   Sodium 09/15/2022 136  135 - 145 mmol/L Final   Potassium 09/15/2022 3.7  3.5 - 5.1 mmol/L Final   Chloride 09/15/2022 99  98 - 111 mmol/L Final   CO2  09/15/2022 23  22 - 32 mmol/L Final   Glucose, Bld 09/15/2022 107 (H)  70 - 99 mg/dL Final   Glucose reference range applies only to samples taken after fasting for at least 8 hours.   BUN 09/15/2022 7  6 - 20 mg/dL Final   Creatinine, Ser 09/15/2022 0.83  0.61 - 1.24 mg/dL Final   Calcium 09/15/2022 8.5 (L)  8.9 - 10.3 mg/dL Final   Total Protein 09/15/2022 7.7  6.5 - 8.1 g/dL Final   Albumin 09/15/2022 4.1  3.5 - 5.0 g/dL Final   AST 09/15/2022 89 (H)  15 - 41 U/L Final   ALT 09/15/2022 54 (H)  0 - 44 U/L Final   Alkaline Phosphatase 09/15/2022 67  38 - 126 U/L Final   Total Bilirubin 09/15/2022 1.2  0.3 - 1.2 mg/dL Final   GFR, Estimated 09/15/2022 >60  >60 mL/min Final   Comment: (NOTE) Calculated using the CKD-EPI Creatinine Equation (2021)    Anion gap 09/15/2022 14  5 - 15 Final   Performed at [facility], [place], [place] [address]   Alcohol, Ethyl (B) 09/15/2022 313 (HH)  <10 mg/dL Final   Comment: CRITICAL RESULT CALLED TO, READ BACK BY AND VERIFIED WITH AMBER PAYNE AT 1722 ON 09/15/22 BY SS (NOTE) Lowest detectable limit for serum alcohol is 10 mg/dL.  For medical purposes only. Performed at [facility], [place], [place] [address]    Color, Urine 09/15/2022 YELLOW (A)  YELLOW Final   APPearance 09/15/2022 CLEAR (A)  CLEAR Final   Specific Gravity, Urine 09/15/2022 1.005  1.005 - 1.030 Final   pH 09/15/2022 6.0  5.0 - 8.0 Final   Glucose, UA 09/15/2022 NEGATIVE  NEGATIVE mg/dL Final   Hgb urine dipstick 09/15/2022 NEGATIVE  NEGATIVE Final   Bilirubin Urine 09/15/2022 NEGATIVE  NEGATIVE Final   Ketones, ur 09/15/2022 NEGATIVE  NEGATIVE mg/dL Final   Protein, ur 09/15/2022 NEGATIVE  NEGATIVE mg/dL Final   Nitrite 09/15/2022 NEGATIVE  NEGATIVE Final   Leukocytes,Ua 09/15/2022 NEGATIVE  NEGATIVE Final   Performed at [facility], [address] [place], [place] [id number]   Tricyclic, Ur Screen 0000000  NONE DETECTED  NONE DETECTED Final   Amphetamines, Ur Screen 09/15/2022 NONE DETECTED  NONE DETECTED Final   MDMA (Ecstasy)Ur Screen 09/15/2022  NONE DETECTED  NONE DETECTED Final   Cocaine Metabolite,Ur [place] 09/15/2022 NONE DETECTED  NONE DETECTED Final   Opiate, Ur Screen 09/15/2022 NONE DETECTED  NONE DETECTED Final   Phencyclidine (PCP) Ur S 09/15/2022 NONE DETECTED  NONE DETECTED Final   Cannabinoid 50 Ng, Ur  09/15/2022 NONE DETECTED  NONE DETECTED Final   Barbiturates, Ur Screen 09/15/2022 NONE DETECTED  NONE DETECTED Final   Benzodiazepine, Ur Scrn 09/15/2022 NONE DETECTED  NONE DETECTED Final   Methadone Scn, Ur 09/15/2022 NONE DETECTED  NONE DETECTED Final   Comment: (NOTE) Tricyclics + metabolites, urine    Cutoff 1000 ng/mL Amphetamines + metabolites, urine  Cutoff 1000 ng/mL MDMA (Ecstasy), urine              Cutoff 500 ng/mL Cocaine Metabolite, urine          Cutoff 300 ng/mL Opiate + metabolites, urine        Cutoff 300 ng/mL Phencyclidine (PCP), urine         Cutoff 25 ng/mL Cannabinoid, urine                 Cutoff 50 ng/mL Barbiturates + metabolites, urine  Cutoff 200 ng/mL Benzodiazepine, urine              Cutoff 200 ng/mL Methadone, urine                   Cutoff 300 ng/mL  The urine drug screen provides only a preliminary, unconfirmed analytical test result and should not be used for non-medical purposes. Clinical consideration and professional judgment should be applied to any positive drug screen result due to possible interfering substances. A more specific alternate chemical method must be used in order to obtain a confirmed analytical result. Gas chromatography / mass spectrometry (GC/MS) is the preferred confirm                          atory method. Performed at [facility], [address] [place], [place] [address]     Allergies: Patient has no known allergies.  Medications:  Facility Ordered Medications  Medication   acetaminophen  (TYLENOL) tablet 650 mg   alum & mag hydroxide-simeth (MAALOX/MYLANTA) 200-200-20 MG/5ML suspension 30 mL   magnesium hydroxide (MILK OF MAGNESIA) suspension 30 mL   thiamine (VITAMIN B1) injection 100 mg   [START ON 10/01/2022] thiamine (VITAMIN B1) tablet 100 mg   multivitamin with minerals tablet 1 tablet   LORazepam (ATIVAN) tablet 1 mg   hydrOXYzine (ATARAX) tablet 25 mg   loperamide (IMODIUM) capsule 2-4 mg   ondansetron (ZOFRAN-ODT) disintegrating tablet 4 mg   PTA Medications  Medication Sig   Glecaprevir-Pibrentasvir (MAVYRET) 100-40 MG TABS Take 3 tablets by mouth daily. (Patient not taking: Reported on 09/19/2022)    Long Term Goals: Improvement in symptoms so as ready for discharge  Short Term Goals: Patient will verbalize feelings in meetings with treatment team members., Pt will complete the PHQ9 on admission, day 3 and discharge., and Patient will take medications as prescribed daily.  Medical Decision Making  [facility] admission  Lab Orders         Resp panel by RT-PCR (RSV, Flu A&B, Covid) Anterior Nasal Swab         CBC with Differential/Platelet         Comprehensive metabolic panel         Hemoglobin A1c         Ethanol  Lipid panel         TSH         POCT Urine Drug Screen - (I-Screen)     Meds ordered this encounter  Medications   acetaminophen (TYLENOL) tablet 650 mg   alum & mag hydroxide-simeth (MAALOX/MYLANTA) 200-200-20 MG/5ML suspension 30 mL   magnesium hydroxide (MILK OF MAGNESIA) suspension 30 mL   thiamine (VITAMIN B1) injection 100 mg   thiamine (VITAMIN B1) tablet 100 mg   multivitamin with minerals tablet 1 tablet   LORazepam (ATIVAN) tablet 1 mg   hydrOXYzine (ATARAX) tablet 25 mg   loperamide (IMODIUM) capsule 2-4 mg   ondansetron (ZOFRAN-ODT) disintegrating tablet 4 mg    Recommendations  Based on my evaluation the patient does not appear to have an emergency medical condition.  Tharon Aquas, NP 09/30/22  10:40 AM

## 2022-10-01 DIAGNOSIS — Z1152 Encounter for screening for COVID-19: Secondary | ICD-10-CM | POA: Diagnosis not present

## 2022-10-01 DIAGNOSIS — F10229 Alcohol dependence with intoxication, unspecified: Secondary | ICD-10-CM | POA: Diagnosis not present

## 2022-10-01 DIAGNOSIS — R5383 Other fatigue: Secondary | ICD-10-CM | POA: Diagnosis not present

## 2022-10-01 DIAGNOSIS — R200 Anesthesia of skin: Secondary | ICD-10-CM | POA: Diagnosis not present

## 2022-10-01 LAB — HEMOGLOBIN A1C
Hgb A1c MFr Bld: 5.4 % (ref 4.8–5.6)
Mean Plasma Glucose: 108 mg/dL

## 2022-10-01 NOTE — Group Note (Signed)
Group Topic: Healthy Self Image and Positive Change  Group Date: 10/01/2022 Start Time: 1030 End Time: 1115 Facilitators: Jennye Moccasin  Department: [facility]  Number of Participants: 6  Group Focus: self-awareness Treatment Modality:  Psychoeducation Interventions utilized were group exercise Purpose: increase insight  Name: Ryan Petty Date of Birth: 06-18-1966  MR: [id number]    Level of Participation: moderate Quality of Participation: attentive and cooperative Interactions with others: gave feedback Mood/Affect: positive Triggers (if applicable): na Cognition: coherent/clear Progress: Moderate Response: na Plan: follow-up needed  Patients Problems:  Patient Active Problem List   Diagnosis Date Noted   ETOH abuse 09/20/2022   Head injury 09/20/2022   MDD (major depressive disorder), recurrent episode, moderate ([place]) 09/19/2022   Alcohol abuse with intoxication ([place]) 09/19/2022   Supraglottitis 11/12/2021   Laryngitis, acute 11/12/2021   Nicotine dependence 11/12/2021   Hepatitis C without hepatic coma 01/13/2019

## 2022-10-01 NOTE — ED Provider Notes (Signed)
Behavioral Health Progress Note  Date and Time: 10/01/2022 2:02 PM Name: SCOTTY KRATOVIL MRN:  [id number]  Subjective:   On assessment 3/6, patient appears calm and alert, a significant improvement from yesterday.  He reports sleeping for a few hours.  Discussed with him that we will try to find him residential rehab at [facility] or [facility], but that both of these are only possibilities.  The patient reports that he is willing to work a job and is willing to consider [place] [facility] and sober living arrangements.  The patient has lab values consistent with liver dysfunction.  His platelets are 126 and his T. bili is 1.5, slight elevation in LFTs.  Discussed with him that he needs to follow-up with a primary care doctor.  Will ask if LCSW can provide recommendations.  The patient denies auditory/visual hallucinations.  The patient reports good mood, appetite, and sleep. They deny suicidal and homicidal thoughts. The patient denies side effects from their medications.  Review of systems as below. The patient denies experiencing any withdrawal symptoms.    Information obtained on initial evaluation: The patient is a 57 year old male with little past psychiatric history, no documented behavioral health admissions.  He presented to the behavioral urgent care requesting help with alcohol use.  He was admitted to the facility based crisis.  On my evaluation 3/5, the patient appears uncomfortable and is rocking back and forth.  As soon as I sit down he says "can I have something to go to sleep".  The patient says he is exhausted and that only alcohol helps him go to sleep.  He states that he thinks either about his girlfriend who died 1 year ago or "nothing".  He denies experiencing hallucinations.  His only physical complaint is numbness in his feet.  The patient reports that he was in prison for 7.5 years for felony gun possession.  He states that he was released in 2019 and since that time has been living with  his mother, who is now dying from cancer.  He states that his girlfriend died 1 year ago and that this was traumatic for him.  Since that time he has had trouble with alcohol use.  He reports that he drinks 2 or 3, 40 ounce beers per day.  His last drink was this morning.  He states that he feels shaky if he does not have a drink of alcohol for an extended period of time.  He denies ever experiencing a seizure or DTs.  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  He states that he occasionally uses crack cocaine or THC.  He states that he has a friend named Elaine/Tara who drove him here to get help.  He also states that he has a new girlfriend who uses crack frequently.  The patient reports experiencing depression and says that he has had suicidal thoughts with no plan.  He contracts for safety.  Diagnosis:  Final diagnoses:  Alcohol abuse with intoxication ([place])    Total Time spent with patient: 20 minutes  Past Psychiatric History: as above Past Medical History: as above Family History: none Family Psychiatric  History: none Social History: as above and per H and P  Additional Social History:  See H and P                  Sleep: Fair  Appetite:  Fair   Current Medications:  Current Facility-Administered Medications  Medication Dose Route Frequency Provider Last Rate Last Admin   acetaminophen (TYLENOL) tablet 650 mg  650 mg Oral Q6H PRN Tharon Aquas, NP       alum & mag hydroxide-simeth (MAALOX/MYLANTA) 200-200-20 MG/5ML suspension 30 mL  30 mL Oral Q4H PRN Tharon Aquas, NP       gabapentin (NEURONTIN) capsule 200 mg  200 mg Oral BID Corky Sox, MD   200 mg at 10/01/22 [id number]   hydrOXYzine (ATARAX) tablet 25 mg  25 mg Oral Q6H PRN Tharon Aquas, NP       loperamide (IMODIUM) capsule 2-4 mg  2-4 mg Oral PRN Tharon Aquas, NP   2 mg at 10/01/22 1341    LORazepam (ATIVAN) tablet 1 mg  1 mg Oral Q6H PRN Corky Sox, MD       LORazepam (ATIVAN) tablet 1 mg  1 mg Oral TID Corky Sox, MD   1 mg at 10/01/22 [id number]   Followed by   Derrill Memo ON 10/02/2022] LORazepam (ATIVAN) tablet 1 mg  1 mg Oral BID Corky Sox, MD       Followed by   Derrill Memo ON 10/03/2022] LORazepam (ATIVAN) tablet 1 mg  1 mg Oral Daily Corky Sox, MD       magnesium hydroxide (MILK OF MAGNESIA) suspension 30 mL  30 mL Oral Daily PRN Tharon Aquas, NP       multivitamin with minerals tablet 1 tablet  1 tablet Oral Daily Corky Sox, MD   1 tablet at 10/01/22 0905   nicotine (NICODERM CQ - dosed in mg/24 hours) patch 21 mg  21 mg Transdermal Daily Tharon Aquas, NP   21 mg at 10/01/22 0905   ondansetron (ZOFRAN-ODT) disintegrating tablet 4 mg  4 mg Oral Q6H PRN Tharon Aquas, NP       thiamine (VITAMIN B1) tablet 100 mg  100 mg Oral Daily Corky Sox, MD   100 mg at 10/01/22 0906   traZODone (DESYREL) tablet 50 mg  50 mg Oral QHS PRN Corky Sox, MD   50 mg at 09/30/22 2119   No current outpatient medications on file.    Labs  Lab Results:  Admission on 09/30/2022  Component Date Value Ref Range Status   SARS Coronavirus 2 by RT PCR 09/30/2022 NEGATIVE  NEGATIVE Final   Influenza A by PCR 09/30/2022 NEGATIVE  NEGATIVE Final   Influenza B by PCR 09/30/2022 NEGATIVE  NEGATIVE Final   Comment: (NOTE) The Xpert Xpress SARS-CoV-2/FLU/RSV plus assay is intended as an aid in the diagnosis of influenza from Nasopharyngeal swab specimens and should not be used as a sole basis for treatment. Nasal washings and aspirates are unacceptable for Xpert Xpress SARS-CoV-2/FLU/RSV testing.  Fact Sheet for Patients: EntrepreneurPulse.com.au  Fact Sheet for Healthcare Providers: IncredibleEmployment.be  This test is not yet approved or cleared by the [place] FDA and has been authorized for detection and/or  diagnosis of SARS-CoV-2 by FDA under an Emergency Use Authorization (EUA). This EUA will remain in effect (meaning this test can be used) for the duration of the COVID-19 declaration under Section 564(b)(1) of the Act, 21 U.S.C. section 360bbb-3(b)(1), unless the authorization is terminated or revoked.     Resp Syncytial Virus by PCR 09/30/2022 NEGATIVE  NEGATIVE Final   Comment: (NOTE) Fact Sheet for Patients: EntrepreneurPulse.com.au  Fact Sheet for Healthcare Providers: IncredibleEmployment.be  This test is not yet approved or cleared by  the [employer] and has been authorized for detection and/or diagnosis of SARS-CoV-2 by FDA under an Emergency Use Authorization (EUA). This EUA will remain in effect (meaning this test can be used) for the duration of the COVID-19 declaration under Section 564(b)(1) of the Act, 21 U.S.C. section 360bbb-3(b)(1), unless the authorization is terminated or revoked.  Performed at [facility], [place] [address], [place], [place] [address]    WBC 09/30/2022 4.4  4.0 - 10.5 K/uL Final   RBC 09/30/2022 4.11 (L)  4.22 - 5.81 MIL/uL Final   Hemoglobin 09/30/2022 14.7  13.0 - 17.0 g/dL Final   HCT 09/30/2022 39.9  39.0 - 52.0 % Final   MCV 09/30/2022 97.1  80.0 - 100.0 fL Final   MCH 09/30/2022 35.8 (H)  26.0 - 34.0 pg Final   MCHC 09/30/2022 36.8 (H)  30.0 - 36.0 g/dL Final   RDW 09/30/2022 15.7 (H)  11.5 - 15.5 % Final   Platelets 09/30/2022 126 (L)  150 - 400 K/uL Final   nRBC 09/30/2022 0.0  0.0 - 0.2 % Final   Neutrophils Relative % 09/30/2022 51  % Final   Neutro Abs 09/30/2022 2.2  1.7 - 7.7 K/uL Final   Lymphocytes Relative 09/30/2022 36  % Final   Lymphs Abs 09/30/2022 1.6  0.7 - 4.0 K/uL Final   Monocytes Relative 09/30/2022 11  % Final   Monocytes Absolute 09/30/2022 0.5  0.1 - 1.0 K/uL Final   Eosinophils Relative 09/30/2022 1  % Final   Eosinophils Absolute 09/30/2022 0.0  0.0 - 0.5 K/uL  Final   Basophils Relative 09/30/2022 1  % Final   Basophils Absolute 09/30/2022 0.1  0.0 - 0.1 K/uL Final   Immature Granulocytes 09/30/2022 0  % Final   Abs Immature Granulocytes 09/30/2022 0.00  0.00 - 0.07 K/uL Final   Performed at [facility], [place] [address], [place], [place] [address]   Sodium 09/30/2022 140  135 - 145 mmol/L Final   Potassium 09/30/2022 4.1  3.5 - 5.1 mmol/L Final   Chloride 09/30/2022 100  98 - 111 mmol/L Final   CO2 09/30/2022 24  22 - 32 mmol/L Final   Glucose, Bld 09/30/2022 71  70 - 99 mg/dL Final   Glucose reference range applies only to samples taken after fasting for at least 8 hours.   BUN 09/30/2022 8  6 - 20 mg/dL Final   Creatinine, Ser 09/30/2022 1.01  0.61 - 1.24 mg/dL Final   Calcium 09/30/2022 8.5 (L)  8.9 - 10.3 mg/dL Final   Total Protein 09/30/2022 7.5  6.5 - 8.1 g/dL Final   Albumin 09/30/2022 4.2  3.5 - 5.0 g/dL Final   AST 09/30/2022 155 (H)  15 - 41 U/L Final   ALT 09/30/2022 78 (H)  0 - 44 U/L Final   Alkaline Phosphatase 09/30/2022 73  38 - 126 U/L Final   Total Bilirubin 09/30/2022 1.5 (H)  0.3 - 1.2 mg/dL Final   GFR, Estimated 09/30/2022 >60  >60 mL/min Final   Comment: (NOTE) Calculated using the CKD-EPI Creatinine Equation (2021)    Anion gap 09/30/2022 16 (H)  5 - 15 Final   Performed at [facility], [place] [address] [place], [place] [address]   Hgb A1c MFr Bld 09/30/2022 5.4  4.8 - 5.6 % Final   Comment: (NOTE)         Prediabetes: 5.7 - 6.4         Diabetes: >  6.4         Glycemic control for adults with diabetes: <7.0    Mean Plasma Glucose 09/30/2022 108  mg/dL Final   Comment: (NOTE) Performed At: [facility] [place], [place] [id number] Rush Farmer MD [id number]    Alcohol, Ethyl (B) 09/30/2022 214 (H)  <10 mg/dL Final   Comment: (NOTE) Lowest detectable limit for serum alcohol is 10 mg/dL.  For medical purposes only. Performed at [facility], [place] [address]  [place], [place] [address]    TSH 09/30/2022 0.590  0.350 - 4.500 uIU/mL Final   Comment: Performed by a 3rd Generation assay with a functional sensitivity of <=0.01 uIU/mL. Performed at [facility], [place] [address], [place], [place] [address]    POC Amphetamine UR 09/30/2022 None Detected  NONE DETECTED (Cut Off Level 1000 ng/mL) Final   POC Secobarbital (BAR) 09/30/2022 None Detected  NONE DETECTED (Cut Off Level 300 ng/mL) Final   POC Buprenorphine (BUP) 09/30/2022 None Detected  NONE DETECTED (Cut Off Level 10 ng/mL) Final   POC Oxazepam (BZO) 09/30/2022 None Detected  NONE DETECTED (Cut Off Level 300 ng/mL) Final   POC Cocaine UR 09/30/2022 None Detected  NONE DETECTED (Cut Off Level 300 ng/mL) Final   POC Methamphetamine UR 09/30/2022 None Detected  NONE DETECTED (Cut Off Level 1000 ng/mL) Final   POC Morphine 09/30/2022 None Detected  NONE DETECTED (Cut Off Level 300 ng/mL) Final   POC Methadone UR 09/30/2022 None Detected  NONE DETECTED (Cut Off Level 300 ng/mL) Final   POC Oxycodone UR 09/30/2022 None Detected  NONE DETECTED (Cut Off Level 100 ng/mL) Final   POC Marijuana UR 09/30/2022 Positive (A)  NONE DETECTED (Cut Off Level 50 ng/mL) Final   SARSCOV2ONAVIRUS 2 AG 09/30/2022 NEGATIVE  NEGATIVE Final   Comment: (NOTE) SARS-CoV-2 antigen NOT DETECTED.   Negative results are presumptive.  Negative results do not preclude SARS-CoV-2 infection and should not be used as the sole basis for treatment or other patient management decisions, including infection  control decisions, particularly in the presence of clinical signs and  symptoms consistent with COVID-19, or in those who have been in contact with the virus.  Negative results must be combined with clinical observations, patient history, and epidemiological information. The expected result is Negative.  Fact Sheet for Patients: HandmadeRecipes.com.cy  Fact Sheet for Healthcare  Providers: FuneralLife.at  This test is not yet approved or cleared by the [place] FDA and  has been authorized for detection and/or diagnosis of SARS-CoV-2 by FDA under an Emergency Use Authorization (EUA).  This EUA will remain in effect (meaning this test can be used) for the duration of  the COV                          ID-19 declaration under Section 564(b)(1) of the Act, 21 U.S.C. section 360bbb-3(b)(1), unless the authorization is terminated or revoked sooner.     Cholesterol 09/30/2022 224 (H)  0 - 200 mg/dL Final   Triglycerides 09/30/2022 67  <150 mg/dL Final   HDL 09/30/2022 >135  >40 mg/dL Final   Total CHOL/HDL Ratio 09/30/2022 NOT CALCULATED  RATIO Final   VLDL 09/30/2022 13  0 - 40 mg/dL Final   LDL Cholesterol 09/30/2022 NOT CALCULATED  0 - 99 mg/dL Final   Performed at [place] [address], [place], [place] [address]  Admission on 09/19/2022, Discharged on 09/20/2022  Component Date Value Ref Range Status   WBC 09/19/2022 6.3  4.0 - 10.5 K/uL Final   RBC 09/19/2022 4.15 (L)  4.22 - 5.81 MIL/uL Final   Hemoglobin 09/19/2022 14.6  13.0 - 17.0 g/dL Final   HCT 09/19/2022 41.7  39.0 - 52.0 % Final   MCV 09/19/2022 100.5 (H)  80.0 - 100.0 fL Final   MCH 09/19/2022 35.2 (H)  26.0 - 34.0 pg Final   MCHC 09/19/2022 35.0  30.0 - 36.0 g/dL Final   RDW 09/19/2022 14.9  11.5 - 15.5 % Final   Platelets 09/19/2022 96 (L)  150 - 400 K/uL Final   nRBC 09/19/2022 0.0  0.0 - 0.2 % Final   Neutrophils Relative % 09/19/2022 55  % Final   Neutro Abs 09/19/2022 3.5  1.7 - 7.7 K/uL Final   Lymphocytes Relative 09/19/2022 31  % Final   Lymphs Abs 09/19/2022 1.9  0.7 - 4.0 K/uL Final   Monocytes Relative 09/19/2022 11  % Final   Monocytes Absolute 09/19/2022 0.7  0.1 - 1.0 K/uL Final   Eosinophils Relative 09/19/2022 1  % Final   Eosinophils Absolute 09/19/2022 0.1  0.0 - 0.5 K/uL Final   Basophils Relative 09/19/2022 1  % Final    Basophils Absolute 09/19/2022 0.1  0.0 - 0.1 K/uL Final   Immature Granulocytes 09/19/2022 1  % Final   Abs Immature Granulocytes 09/19/2022 0.03  0.00 - 0.07 K/uL Final   Performed at [facility], [place] [place], [place] [address]   Color, Urine 09/19/2022 STRAW (A)  YELLOW Final   APPearance 09/19/2022 CLEAR (A)  CLEAR Final   Specific Gravity, Urine 09/19/2022 1.004 (L)  1.005 - 1.030 Final   pH 09/19/2022 5.0  5.0 - 8.0 Final   Glucose, UA 09/19/2022 NEGATIVE  NEGATIVE mg/dL Final   Hgb urine dipstick 09/19/2022 NEGATIVE  NEGATIVE Final   Bilirubin Urine 09/19/2022 NEGATIVE  NEGATIVE Final   Ketones, ur 09/19/2022 NEGATIVE  NEGATIVE mg/dL Final   Protein, ur 09/19/2022 NEGATIVE  NEGATIVE mg/dL Final   Nitrite 09/19/2022 NEGATIVE  NEGATIVE Final   Leukocytes,Ua 09/19/2022 NEGATIVE  NEGATIVE Final   Performed at [facility], [place] [place], [place] [id number]   Tricyclic, Ur Screen [id number] NONE DETECTED  NONE DETECTED Final   Amphetamines, Ur Screen 09/19/2022 NONE DETECTED  NONE DETECTED Final   MDMA (Ecstasy)Ur Screen 09/19/2022 NONE DETECTED  NONE DETECTED Final   Cocaine Metabolite,Ur [place] 09/19/2022 NONE DETECTED  NONE DETECTED Final   Opiate, Ur Screen 09/19/2022 NONE DETECTED  NONE DETECTED Final   Phencyclidine (PCP) Ur S 09/19/2022 NONE DETECTED  NONE DETECTED Final   Cannabinoid 50 Ng, Ur [place] 09/19/2022 NONE DETECTED  NONE DETECTED Final   Barbiturates, Ur Screen 09/19/2022 NONE DETECTED  NONE DETECTED Final   Benzodiazepine, Ur Scrn 09/19/2022 NONE DETECTED  NONE DETECTED Final   Methadone Scn, Ur 09/19/2022 NONE DETECTED  NONE DETECTED Final   Comment: (NOTE) Tricyclics + metabolites, urine    Cutoff 1000 ng/mL Amphetamines + metabolites, urine  Cutoff 1000 ng/mL MDMA (Ecstasy), urine              Cutoff 500 ng/mL Cocaine Metabolite, urine          Cutoff 300 ng/mL Opiate + metabolites, urine        Cutoff 300 ng/mL Phencyclidine  (PCP), urine         Cutoff 25 ng/mL Cannabinoid, urine  Cutoff 50 ng/mL Barbiturates + metabolites, urine  Cutoff 200 ng/mL Benzodiazepine, urine              Cutoff 200 ng/mL Methadone, urine                   Cutoff 300 ng/mL  The urine drug screen provides only a preliminary, unconfirmed analytical test result and should not be used for non-medical purposes. Clinical consideration and professional judgment should be applied to any positive drug screen result due to possible interfering substances. A more specific alternate chemical method must be used in order to obtain a confirmed analytical result. Gas chromatography / mass spectrometry (GC/MS) is the preferred confirm                          atory method. Performed at [facility], [place], [place] [address]    Troponin I (High Sensitivity) 09/19/2022 6  <18 ng/L Final   Comment: (NOTE) Elevated high sensitivity troponin I (hsTnI) values and significant  changes across serial measurements may suggest ACS but many other  chronic and acute conditions are known to elevate hsTnI results.  Refer to the Links section for chest pain algorithms and additional  guidance. Performed at [facility], [address] [place], [place] [address]    SARS Coronavirus 2 by RT PCR 09/19/2022 NEGATIVE  NEGATIVE Final   Comment: (NOTE) SARS-CoV-2 target nucleic acids are NOT DETECTED.  The SARS-CoV-2 RNA is generally detectable in upper respiratory specimens during the acute phase of infection. The lowest concentration of SARS-CoV-2 viral copies this assay can detect is 138 copies/mL. A negative result does not preclude SARS-Cov-2 infection and should not be used as the sole basis for treatment or other patient management decisions. A negative result may occur with  improper specimen collection/handling, submission of specimen other than nasopharyngeal swab, presence of viral mutation(s)  within the areas targeted by this assay, and inadequate number of viral copies(<138 copies/mL). A negative result must be combined with clinical observations, patient history, and epidemiological information. The expected result is Negative.  Fact Sheet for Patients:  EntrepreneurPulse.com.au  Fact Sheet for Healthcare Providers:  IncredibleEmployment.be  This test is no                          t yet approved or cleared by the [place] FDA and  has been authorized for detection and/or diagnosis of SARS-CoV-2 by FDA under an Emergency Use Authorization (EUA). This EUA will remain  in effect (meaning this test can be used) for the duration of the COVID-19 declaration under Section 564(b)(1) of the Act, 21 U.S.C.section 360bbb-3(b)(1), unless the authorization is terminated  or revoked sooner.       Influenza A by PCR 09/19/2022 NEGATIVE  NEGATIVE Final   Influenza B by PCR 09/19/2022 NEGATIVE  NEGATIVE Final   Comment: (NOTE) The Xpert Xpress SARS-CoV-2/FLU/RSV plus assay is intended as an aid in the diagnosis of influenza from Nasopharyngeal swab specimens and should not be used as a sole basis for treatment. Nasal washings and aspirates are unacceptable for Xpert Xpress SARS-CoV-2/FLU/RSV testing.  Fact Sheet for Patients: EntrepreneurPulse.com.au  Fact Sheet for Healthcare Providers: IncredibleEmployment.be  This test is not yet approved or cleared by the [place] FDA and has been authorized for detection and/or diagnosis of SARS-CoV-2 by FDA under an Emergency Use Authorization (EUA). This EUA will remain  in effect (meaning this test can be used) for the duration of the COVID-19 declaration under Section 564(b)(1) of the Act, 21 U.S.C. section 360bbb-3(b)(1), unless the authorization is terminated or revoked.     Resp Syncytial Virus by PCR 09/19/2022 NEGATIVE  NEGATIVE Final   Comment:  (NOTE) Fact Sheet for Patients: EntrepreneurPulse.com.au  Fact Sheet for Healthcare Providers: IncredibleEmployment.be  This test is not yet approved or cleared by the [place] FDA and has been authorized for detection and/or diagnosis of SARS-CoV-2 by FDA under an Emergency Use Authorization (EUA). This EUA will remain in effect (meaning this test can be used) for the duration of the COVID-19 declaration under Section 564(b)(1) of the Act, 21 U.S.C. section 360bbb-3(b)(1), unless the authorization is terminated or revoked.  Performed at [facility], [address] [place], [place] [address]    Group A Strep by PCR 09/19/2022 NOT DETECTED  NOT DETECTED Final   Performed at [facility], [place], [place] [address]   Sodium 09/19/2022 143  135 - 145 mmol/L Final   Potassium 09/19/2022 3.9  3.5 - 5.1 mmol/L Final   Chloride 09/19/2022 106  98 - 111 mmol/L Final   CO2 09/19/2022 25  22 - 32 mmol/L Final   Glucose, Bld 09/19/2022 83  70 - 99 mg/dL Final   Glucose reference range applies only to samples taken after fasting for at least 8 hours.   BUN 09/19/2022 8  6 - 20 mg/dL Final   Creatinine, Ser 09/19/2022 0.92  0.61 - 1.24 mg/dL Final   Calcium 09/19/2022 7.9 (L)  8.9 - 10.3 mg/dL Final   Total Protein 09/19/2022 7.0  6.5 - 8.1 g/dL Final   Albumin 09/19/2022 3.8  3.5 - 5.0 g/dL Final   AST 09/19/2022 97 (H)  15 - 41 U/L Final   ALT 09/19/2022 48 (H)  0 - 44 U/L Final   Alkaline Phosphatase 09/19/2022 60  38 - 126 U/L Final   Total Bilirubin 09/19/2022 0.9  0.3 - 1.2 mg/dL Final   GFR, Estimated 09/19/2022 >60  >60 mL/min Final   Comment: (NOTE) Calculated using the CKD-EPI Creatinine Equation (2021)    Anion gap 09/19/2022 12  5 - 15 Final   Performed at [facility], [place] [place], [place] [address]   Lipase 09/19/2022 51  11 - 51 U/L Final   Performed at [facility], [place] [address] [place], [place] [address]   Troponin I (High Sensitivity) 09/19/2022 8  <18 ng/L Final   Comment: (NOTE) Elevated high sensitivity troponin I (hsTnI) values and significant  changes across serial measurements may suggest ACS but many other  chronic and acute conditions are known to elevate hsTnI results.  Refer to the "Links" section for chest pain algorithms and additional  guidance. Performed at [facility], [address] [place], [place] [address]   Admission on 09/15/2022, Discharged on 09/15/2022  Component Date Value Ref Range Status   WBC 09/15/2022 4.9  4.0 - 10.5 K/uL Final   RBC 09/15/2022 4.09 (L)  4.22 - 5.81 MIL/uL Final   Hemoglobin 09/15/2022 14.5  13.0 - 17.0 g/dL Final   HCT 09/15/2022 39.9  39.0 - 52.0 % Final   MCV 09/15/2022 97.6  80.0 - 100.0 fL Final   MCH 09/15/2022 35.5 (H)  26.0 - 34.0 pg Final   MCHC 09/15/2022 36.3 (H)  30.0 - 36.0 g/dL Final   RDW 09/15/2022 13.9  11.5 -  15.5 % Final   Platelets 09/15/2022 102 (L)  150 - 400 K/uL Final   nRBC 09/15/2022 0.0  0.0 - 0.2 % Final   Neutrophils Relative % 09/15/2022 37  % Final   Neutro Abs 09/15/2022 1.8  1.7 - 7.7 K/uL Final   Lymphocytes Relative 09/15/2022 49  % Final   Lymphs Abs 09/15/2022 2.4  0.7 - 4.0 K/uL Final   Monocytes Relative 09/15/2022 13  % Final   Monocytes Absolute 09/15/2022 0.6  0.1 - 1.0 K/uL Final   Eosinophils Relative 09/15/2022 0  % Final   Eosinophils Absolute 09/15/2022 0.0  0.0 - 0.5 K/uL Final   Basophils Relative 09/15/2022 1  % Final   Basophils Absolute 09/15/2022 0.1  0.0 - 0.1 K/uL Final   Immature Granulocytes 09/15/2022 0  % Final   Abs Immature Granulocytes 09/15/2022 0.00  0.00 - 0.07 K/uL Final   Performed at [facility], [place], [place] [address]   Sodium 09/15/2022 136  135 - 145 mmol/L Final   Potassium 09/15/2022 3.7  3.5 - 5.1 mmol/L Final   Chloride 09/15/2022 99  98 - 111 mmol/L Final   CO2  09/15/2022 23  22 - 32 mmol/L Final   Glucose, Bld 09/15/2022 107 (H)  70 - 99 mg/dL Final   Glucose reference range applies only to samples taken after fasting for at least 8 hours.   BUN 09/15/2022 7  6 - 20 mg/dL Final   Creatinine, Ser 09/15/2022 0.83  0.61 - 1.24 mg/dL Final   Calcium 09/15/2022 8.5 (L)  8.9 - 10.3 mg/dL Final   Total Protein 09/15/2022 7.7  6.5 - 8.1 g/dL Final   Albumin 09/15/2022 4.1  3.5 - 5.0 g/dL Final   AST 09/15/2022 89 (H)  15 - 41 U/L Final   ALT 09/15/2022 54 (H)  0 - 44 U/L Final   Alkaline Phosphatase 09/15/2022 67  38 - 126 U/L Final   Total Bilirubin 09/15/2022 1.2  0.3 - 1.2 mg/dL Final   GFR, Estimated 09/15/2022 >60  >60 mL/min Final   Comment: (NOTE) Calculated using the CKD-EPI Creatinine Equation (2021)    Anion gap 09/15/2022 14  5 - 15 Final   Performed at [facility], [place], [place] [address]   Alcohol, Ethyl (B) 09/15/2022 313 (HH)  <10 mg/dL Final   Comment: CRITICAL RESULT CALLED TO, READ BACK BY AND VERIFIED WITH AMBER PAYNE AT 1722 ON 09/15/22 BY SS (NOTE) Lowest detectable limit for serum alcohol is 10 mg/dL.  For medical purposes only. Performed at [facility], [place], [place] [address]    Color, Urine 09/15/2022 YELLOW (A)  YELLOW Final   APPearance 09/15/2022 CLEAR (A)  CLEAR Final   Specific Gravity, Urine 09/15/2022 1.005  1.005 - 1.030 Final   pH 09/15/2022 6.0  5.0 - 8.0 Final   Glucose, UA 09/15/2022 NEGATIVE  NEGATIVE mg/dL Final   Hgb urine dipstick 09/15/2022 NEGATIVE  NEGATIVE Final   Bilirubin Urine 09/15/2022 NEGATIVE  NEGATIVE Final   Ketones, ur 09/15/2022 NEGATIVE  NEGATIVE mg/dL Final   Protein, ur 09/15/2022 NEGATIVE  NEGATIVE mg/dL Final   Nitrite 09/15/2022 NEGATIVE  NEGATIVE Final   Leukocytes,Ua 09/15/2022 NEGATIVE  NEGATIVE Final   Performed at [facility], [address] [place], [place] [id number]   Tricyclic, Ur Screen 0000000  NONE DETECTED  NONE DETECTED Final   Amphetamines, Ur Screen 09/15/2022 NONE DETECTED  NONE  DETECTED Final   MDMA (Ecstasy)Ur Screen 09/15/2022 NONE DETECTED  NONE DETECTED Final   Cocaine Metabolite,Ur [place] 09/15/2022 NONE DETECTED  NONE DETECTED Final   Opiate, Ur Screen 09/15/2022 NONE DETECTED  NONE DETECTED Final   Phencyclidine (PCP) Ur S 09/15/2022 NONE DETECTED  NONE DETECTED Final   Cannabinoid 50 Ng, Ur [place] 09/15/2022 NONE DETECTED  NONE DETECTED Final   Barbiturates, Ur Screen 09/15/2022 NONE DETECTED  NONE DETECTED Final   Benzodiazepine, Ur Scrn 09/15/2022 NONE DETECTED  NONE DETECTED Final   Methadone Scn, Ur 09/15/2022 NONE DETECTED  NONE DETECTED Final   Comment: (NOTE) Tricyclics + metabolites, urine    Cutoff 1000 ng/mL Amphetamines + metabolites, urine  Cutoff 1000 ng/mL MDMA (Ecstasy), urine              Cutoff 500 ng/mL Cocaine Metabolite, urine          Cutoff 300 ng/mL Opiate + metabolites, urine        Cutoff 300 ng/mL Phencyclidine (PCP), urine         Cutoff 25 ng/mL Cannabinoid, urine                 Cutoff 50 ng/mL Barbiturates + metabolites, urine  Cutoff 200 ng/mL Benzodiazepine, urine              Cutoff 200 ng/mL Methadone, urine                   Cutoff 300 ng/mL  The urine drug screen provides only a preliminary, unconfirmed analytical test result and should not be used for non-medical purposes. Clinical consideration and professional judgment should be applied to any positive drug screen result due to possible interfering substances. A more specific alternate chemical method must be used in order to obtain a confirmed analytical result. Gas chromatography / mass spectrometry (GC/MS) is the preferred confirm                          atory method. Performed at [facility], [address] [place], [place] [address]     Blood Alcohol level:  Lab Results  Component Value Date   ETH 214 (H) 09/30/2022   ETH 313 (HH) 0000000     Metabolic Disorder Labs: Lab Results  Component Value Date   HGBA1C 5.4 09/30/2022   MPG 108 09/30/2022   No results found for: "PROLACTIN" Lab Results  Component Value Date   CHOL 224 (H) 09/30/2022   TRIG 67 09/30/2022   HDL >135 09/30/2022   CHOLHDL NOT CALCULATED 09/30/2022   VLDL 13 09/30/2022   LDLCALC NOT CALCULATED 09/30/2022    Therapeutic Lab Levels: No results found for: "LITHIUM" No results found for: "VALPROATE" No results found for: "CBMZ"  Physical Findings   PHQ2-9    Flowsheet Row ED from 09/30/2022 in [facility]  PHQ-2 Total Score 6  PHQ-9 Total Score 25      Flowsheet Row ED from 09/30/2022 in [facility] ED from 09/19/2022 in [facility] Emergency Department at [facility] ED from 09/15/2022 in [facility] Emergency Department at [place] No Risk Low Risk No Risk        Musculoskeletal  Strength & Muscle Tone: within normal limits Gait & Station: normal Patient leans: N/A  Psychiatric Specialty Exam  Presentation General Appearance: Appropriate for Environment  Eye Contact:Fair  Speech:Clear and Coherent  Speech Volume:Normal  Handedness:-- (not assessed)   Mood and Affect  Mood:Euthymic  Affect:Congruent   Thought Process  Thought Processes:Coherent; Linear  Descriptions of Associations:Intact  Orientation:Full (Time, Place and Person)  Thought Content:Logical    Hallucinations:Hallucinations: None  Ideas of Reference:None  Suicidal Thoughts:Suicidal Thoughts: No  Homicidal Thoughts:Homicidal Thoughts: No   Sensorium  Memory:Immediate Fair; Recent Fair; Remote Fair  Judgment:Fair  Insight:Fair   Executive Functions  Concentration:Fair  Attention Span:Fair  [place]   Psychomotor Activity  Psychomotor Activity:Psychomotor Activity: Normal   Assets   Assets:Communication Skills; Resilience   Sleep  Sleep:Sleep: Fair   Nutritional Assessment (For OBS and [facility] admissions only) Has the patient had a weight loss or gain of 10 pounds or more in the last 3 months?: No Has the patient had a decrease in food intake/or appetite?: Yes Does the patient have dental problems?: No Does the patient have eating habits or behaviors that may be indicators of an eating disorder including binging or inducing vomiting?: No Has the patient recently lost weight without trying?: 0 Has the patient been eating poorly because of a decreased appetite?: 0 Malnutrition Screening Tool Score: 0    Physical Exam Constitutional:      Appearance: the patient is not toxic-appearing.  Pulmonary:     Effort: Pulmonary effort is normal.  Neurological:     General: No focal deficit present.     Mental Status: the patient is alert and oriented to person, place, and time.   Review of Systems  Respiratory:  Negative for shortness of breath.   Cardiovascular:  Negative for chest pain.  Gastrointestinal:  Negative for abdominal pain, constipation, diarrhea, nausea and vomiting.  Neurological:  Negative for headaches.    BP 139/85 (BP Location: Right Arm)   Pulse 77   Temp 98.7 F (37.1 C) (Oral)   Resp 18   SpO2 100%   Assessment and Plan:  Status: Voluntary, no acute safety concerns given lack of previous suicidality and patient contracting for safety  Alcohol use disorder, severe, active withdrawal, no history of severe withdrawal - Ativan taper with gabapentin, CIWA scoring with as needed Ativan - As needed trazodone for sleep - Patient will engage in detox and consider residential rehab  Medical: - Routine labs notable for decreased platelets and increased T. bili consistent with impaired hepatic function - Thiamine - Hypertension and tachycardia, suspect secondary to withdrawal    Corky Sox, MD 10/01/2022 2:02 PM

## 2022-10-01 NOTE — Tx Team (Signed)
LCSW met with patient to assess current mood, affect, physical state, and inquire about needs/goals while here in [facility] and after discharge. Patient reports he presented due to using alcohol "for a few days straight and feeling very depressed". Patient reports he has been experiencing crying spells, shakes, lack of sleep, and a poor appetite. Patient reports he has been drinking alcohol for 40+ years, however noticed an increase in his drinking after the loss of his girlfriend last year. Brief supportive counseling was provided to the patient and he was receptive to the support. Patient reports a 9 year period of sobriety and reports 7 of those years were served in prison, and reports 2 successful years once discharged. Patient reports having a history of crack use, however reports "every blue moon" he would smoke some with his male friend. Patient reports precipitating factors of stress is his current alcohol use, unemployment, lack of funds, legal issues, and his mother's current health condition. Patient reports his mother is currently suffering through cancer and her prognosis does not look well. Patient reports he recently received a DWI and has to appear in court on Dec 16, 2022. Patient reports he is unsure what to expect with the case and reports there is stress around that. No other legal charges reported. Patient reports he lives with his mother majority of the time, and other times he is over his friend's house. Patient reports his current goal is to seek residential placement for alcohol use. Patient reports a prior history of treatment many years ago at [facility]. No other services reported. Patient aware that LCSW will send referrals out for review and will follow up to provide updates as received. Patient expressed understanding and appreciation of LCSW assistance. No other needs were reported at this time by patient.   Referral has been sent to [place] for review. Per Enis Gash,  once reviewed she will follow up with LCSW to provide update. LCSW will continue to follow and provide support to patient while on [facility] unit.   Lucius Conn, LCSW Clinical Social Worker [facility] Ph: [phone]

## 2022-10-01 NOTE — ED Notes (Signed)
Patient observed/assessed in room in bed appearing in no immediate distress resting peacefully. Q15 minute checks continued by MHT and nursing staff. Will continue to monitor and support. 

## 2022-10-01 NOTE — ED Notes (Signed)
Patient is awake and alert sitting in dayroom eating breakfast.  Patient without withdrawal symptoms or somatic complaint.  Will monitor.

## 2022-10-01 NOTE — ED Notes (Signed)
Patient has been awake and alert on unit all day.  He attended each group offered and participated well.  He is calm and cooperative.  He complained of diarrhea earlier and imodium given and was helpful according to patient.  No evidence of withdrawal or somatic distress.  Will monitor and provide a safe environment.

## 2022-10-01 NOTE — ED Notes (Addendum)
Patient observed/assessed at  Patient alert and oriented x 4. Affect is flat/sullen. Patient denies pain and anxiety. He denies A/V/H. He denies having any thoughts/plan of self harm and harm towards others. Fluid and snack offered. Patient states that appetite has been good throughout the day. Verbalizes no further complaints at this time. Will continue to monitor and support.

## 2022-10-02 DIAGNOSIS — F10229 Alcohol dependence with intoxication, unspecified: Secondary | ICD-10-CM | POA: Diagnosis not present

## 2022-10-02 DIAGNOSIS — Z1152 Encounter for screening for COVID-19: Secondary | ICD-10-CM | POA: Diagnosis not present

## 2022-10-02 DIAGNOSIS — R200 Anesthesia of skin: Secondary | ICD-10-CM | POA: Diagnosis not present

## 2022-10-02 DIAGNOSIS — R5383 Other fatigue: Secondary | ICD-10-CM | POA: Diagnosis not present

## 2022-10-02 NOTE — ED Notes (Signed)
Patient A&Ox4. Patient denies SI/Hi and AVH.  Patient denies any physical complaints when asked. No acute distress noted. Support and encouragement provided. Routine safety checks conducted according to facility protocol. Encouraged patient to notify staff if thoughts of harm toward self or others arise. Patient verbalize understanding and agreement. Will continue to monitor for safety.    

## 2022-10-02 NOTE — ED Provider Notes (Signed)
Behavioral Health Progress Note  Date and Time: 10/02/2022 1:33 PM Name: Ryan Petty MRN:  [id number]  Subjective:   On assessment 3/7, the patient reports that he is doing fairly well.  He reports that he wants to stay in [place].  Discussed that there is a potential rehab option in [place].  The patient declines.  LCSW states there are no [facility] with beds available in [place].  Will need to discuss plans going forward in a realistic manner.  On assessment 3/6, patient appears calm and alert, a significant improvement from yesterday.  He reports sleeping for a few hours.  Discussed with him that we will try to find him residential rehab at [facility] or [facility], but that both of these are only possibilities.  The patient reports that he is willing to work a job and is willing to consider [facility] houses and sober living arrangements.  The patient has lab values consistent with liver dysfunction.  His platelets are 126 and his T. bili is 1.5, slight elevation in LFTs.  Discussed with him that he needs to follow-up with a primary care doctor.  Will ask if LCSW can provide recommendations.  The patient denies auditory/visual hallucinations.  The patient reports good mood, appetite, and sleep. They deny suicidal and homicidal thoughts. The patient denies side effects from their medications.  Review of systems as below. The patient denies experiencing any withdrawal symptoms.    Information obtained on initial evaluation: The patient is a 57 year old male with little past psychiatric history, no documented behavioral health admissions.  He presented to the behavioral urgent care requesting help with alcohol use.  He was admitted to the facility based crisis.  On my evaluation 3/5, the patient appears uncomfortable and is rocking back and forth.  As soon as I sit down he says "can I have something to go to sleep".  The patient says he is exhausted and that only alcohol helps him go to sleep.   He states that he thinks either about his girlfriend who died 1 year ago or "nothing".  He denies experiencing hallucinations.  His only physical complaint is numbness in his feet.  The patient reports that he was in prison for 7.5 years for felony gun possession.  He states that he was released in 2019 and since that time has been living with his mother, who is now dying from cancer.  He states that his girlfriend died 1 year ago and that this was traumatic for him.  Since that time he has had trouble with alcohol use.  He reports that he drinks 2 or 3, 40 ounce beers per day.  His last drink was this morning.  He states that he feels shaky if he does not have a drink of alcohol for an extended period of time.  He denies ever experiencing a seizure or DTs.  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  He states that he occasionally uses crack cocaine or THC.  He states that he has a friend named Elaine/Tara who drove him here to get help.  He also states that he has a new girlfriend who uses crack frequently.  The patient reports experiencing depression and says that he has had suicidal thoughts with no plan.  He contracts for safety.  Diagnosis:  Final diagnoses:  Alcohol abuse with intoxication ([place])    Total Time  spent with patient: 20 minutes  Past Psychiatric History: as above Past Medical History: as above Family History: none Family Psychiatric  History: none Social History: as above and per H and P  Additional Social History:  See H and P                  Sleep: Fair  Appetite:  Fair   Current Medications:  Current Facility-Administered Medications  Medication Dose Route Frequency Provider Last Rate Last Admin   acetaminophen (TYLENOL) tablet 650 mg  650 mg Oral Q6H PRN Tharon Aquas, NP       alum & mag hydroxide-simeth (MAALOX/MYLANTA) 200-200-20 MG/5ML  suspension 30 mL  30 mL Oral Q4H PRN Tharon Aquas, NP       gabapentin (NEURONTIN) capsule 200 mg  200 mg Oral BID Corky Sox, MD   200 mg at 10/02/22 [id number]   hydrOXYzine (ATARAX) tablet 25 mg  25 mg Oral Q6H PRN Tharon Aquas, NP       loperamide (IMODIUM) capsule 2-4 mg  2-4 mg Oral PRN Tharon Aquas, NP   2 mg at 10/01/22 1341   LORazepam (ATIVAN) tablet 1 mg  1 mg Oral Q6H PRN Corky Sox, MD       LORazepam (ATIVAN) tablet 1 mg  1 mg Oral BID Corky Sox, MD   1 mg at 10/02/22 0919   Followed by   Derrill Memo ON 10/03/2022] LORazepam (ATIVAN) tablet 1 mg  1 mg Oral Daily Corky Sox, MD       magnesium hydroxide (MILK OF MAGNESIA) suspension 30 mL  30 mL Oral Daily PRN Tharon Aquas, NP       multivitamin with minerals tablet 1 tablet  1 tablet Oral Daily Corky Sox, MD   1 tablet at 10/02/22 0919   nicotine (NICODERM CQ - dosed in mg/24 hours) patch 21 mg  21 mg Transdermal Daily Tharon Aquas, NP   21 mg at 10/02/22 0918   ondansetron (ZOFRAN-ODT) disintegrating tablet 4 mg  4 mg Oral Q6H PRN Tharon Aquas, NP       thiamine (VITAMIN B1) tablet 100 mg  100 mg Oral Daily Corky Sox, MD   100 mg at 10/02/22 0919   traZODone (DESYREL) tablet 50 mg  50 mg Oral QHS PRN Corky Sox, MD   50 mg at 10/01/22 2131   No current outpatient medications on file.    Labs  Lab Results:  Admission on 09/30/2022  Component Date Value Ref Range Status   SARS Coronavirus 2 by RT PCR 09/30/2022 NEGATIVE  NEGATIVE Final   Influenza A by PCR 09/30/2022 NEGATIVE  NEGATIVE Final   Influenza B by PCR 09/30/2022 NEGATIVE  NEGATIVE Final   Comment: (NOTE) The Xpert Xpress SARS-CoV-2/FLU/RSV plus assay is intended as an aid in the diagnosis of influenza from Nasopharyngeal swab specimens and should not be used as a sole basis for treatment. Nasal washings and aspirates are unacceptable for Xpert Xpress SARS-CoV-2/FLU/RSV testing.  Fact Sheet for  Patients: EntrepreneurPulse.com.au  Fact Sheet for Healthcare Providers: IncredibleEmployment.be  This test is not yet approved or cleared by the [place] FDA and has been authorized for detection and/or diagnosis of SARS-CoV-2 by FDA under an Emergency Use Authorization (EUA). This EUA will remain in effect (meaning this test can be used) for the duration of the COVID-19 declaration under Section 564(b)(1) of the Act, 21 U.S.C. section 360bbb-3(b)(1), unless the authorization is terminated or revoked.  Resp Syncytial Virus by PCR 09/30/2022 NEGATIVE  NEGATIVE Final   Comment: (NOTE) Fact Sheet for Patients: EntrepreneurPulse.com.au  Fact Sheet for Healthcare Providers: IncredibleEmployment.be  This test is not yet approved or cleared by the [place] FDA and has been authorized for detection and/or diagnosis of SARS-CoV-2 by FDA under an Emergency Use Authorization (EUA). This EUA will remain in effect (meaning this test can be used) for the duration of the COVID-19 declaration under Section 564(b)(1) of the Act, 21 U.S.C. section 360bbb-3(b)(1), unless the authorization is terminated or revoked.  Performed at [facility], [place] [address] [place], [place] [address]    WBC 09/30/2022 4.4  4.0 - 10.5 K/uL Final   RBC 09/30/2022 4.11 (L)  4.22 - 5.81 MIL/uL Final   Hemoglobin 09/30/2022 14.7  13.0 - 17.0 g/dL Final   HCT 09/30/2022 39.9  39.0 - 52.0 % Final   MCV 09/30/2022 97.1  80.0 - 100.0 fL Final   MCH 09/30/2022 35.8 (H)  26.0 - 34.0 pg Final   MCHC 09/30/2022 36.8 (H)  30.0 - 36.0 g/dL Final   RDW 09/30/2022 15.7 (H)  11.5 - 15.5 % Final   Platelets 09/30/2022 126 (L)  150 - 400 K/uL Final   nRBC 09/30/2022 0.0  0.0 - 0.2 % Final   Neutrophils Relative % 09/30/2022 51  % Final   Neutro Abs 09/30/2022 2.2  1.7 - 7.7 K/uL Final   Lymphocytes Relative 09/30/2022 36  % Final    Lymphs Abs 09/30/2022 1.6  0.7 - 4.0 K/uL Final   Monocytes Relative 09/30/2022 11  % Final   Monocytes Absolute 09/30/2022 0.5  0.1 - 1.0 K/uL Final   Eosinophils Relative 09/30/2022 1  % Final   Eosinophils Absolute 09/30/2022 0.0  0.0 - 0.5 K/uL Final   Basophils Relative 09/30/2022 1  % Final   Basophils Absolute 09/30/2022 0.1  0.0 - 0.1 K/uL Final   Immature Granulocytes 09/30/2022 0  % Final   Abs Immature Granulocytes 09/30/2022 0.00  0.00 - 0.07 K/uL Final   Performed at [facility], [place] [address] [place], [place] [address]   Sodium 09/30/2022 140  135 - 145 mmol/L Final   Potassium 09/30/2022 4.1  3.5 - 5.1 mmol/L Final   Chloride 09/30/2022 100  98 - 111 mmol/L Final   CO2 09/30/2022 24  22 - 32 mmol/L Final   Glucose, Bld 09/30/2022 71  70 - 99 mg/dL Final   Glucose reference range applies only to samples taken after fasting for at least 8 hours.   BUN 09/30/2022 8  6 - 20 mg/dL Final   Creatinine, Ser 09/30/2022 1.01  0.61 - 1.24 mg/dL Final   Calcium 09/30/2022 8.5 (L)  8.9 - 10.3 mg/dL Final   Total Protein 09/30/2022 7.5  6.5 - 8.1 g/dL Final   Albumin 09/30/2022 4.2  3.5 - 5.0 g/dL Final   AST 09/30/2022 155 (H)  15 - 41 U/L Final   ALT 09/30/2022 78 (H)  0 - 44 U/L Final   Alkaline Phosphatase 09/30/2022 73  38 - 126 U/L Final   Total Bilirubin 09/30/2022 1.5 (H)  0.3 - 1.2 mg/dL Final   GFR, Estimated 09/30/2022 >60  >60 mL/min Final   Comment: (NOTE) Calculated using the CKD-EPI Creatinine Equation (2021)    Anion gap 09/30/2022 16 (H)  5 - 15 Final   Performed at [facility], [place] [address], [place], [place] [address]   Hgb  A1c MFr Bld 09/30/2022 5.4  4.8 - 5.6 % Final   Comment: (NOTE)         Prediabetes: 5.7 - 6.4         Diabetes: >6.4         Glycemic control for adults with diabetes: <7.0    Mean Plasma Glucose 09/30/2022 108  mg/dL Final   Comment: (NOTE) Performed At: [facility] [place], [place]  [id number] Rush Farmer MD [id number]    Alcohol, Ethyl (B) 09/30/2022 214 (H)  <10 mg/dL Final   Comment: (NOTE) Lowest detectable limit for serum alcohol is 10 mg/dL.  For medical purposes only. Performed at [facility], [place] [address], [place], [place] [address]    TSH 09/30/2022 0.590  0.350 - 4.500 uIU/mL Final   Comment: Performed by a 3rd Generation assay with a functional sensitivity of <=0.01 uIU/mL. Performed at [facility], [place] [address], [place], [place] [address]    POC Amphetamine UR 09/30/2022 None Detected  NONE DETECTED (Cut Off Level 1000 ng/mL) Final   POC Secobarbital (BAR) 09/30/2022 None Detected  NONE DETECTED (Cut Off Level 300 ng/mL) Final   POC Buprenorphine (BUP) 09/30/2022 None Detected  NONE DETECTED (Cut Off Level 10 ng/mL) Final   POC Oxazepam (BZO) 09/30/2022 None Detected  NONE DETECTED (Cut Off Level 300 ng/mL) Final   POC Cocaine UR 09/30/2022 None Detected  NONE DETECTED (Cut Off Level 300 ng/mL) Final   POC Methamphetamine UR 09/30/2022 None Detected  NONE DETECTED (Cut Off Level 1000 ng/mL) Final   POC Morphine 09/30/2022 None Detected  NONE DETECTED (Cut Off Level 300 ng/mL) Final   POC Methadone UR 09/30/2022 None Detected  NONE DETECTED (Cut Off Level 300 ng/mL) Final   POC Oxycodone UR 09/30/2022 None Detected  NONE DETECTED (Cut Off Level 100 ng/mL) Final   POC Marijuana UR 09/30/2022 Positive (A)  NONE DETECTED (Cut Off Level 50 ng/mL) Final   SARSCOV2ONAVIRUS 2 AG 09/30/2022 NEGATIVE  NEGATIVE Final   Comment: (NOTE) SARS-CoV-2 antigen NOT DETECTED.   Negative results are presumptive.  Negative results do not preclude SARS-CoV-2 infection and should not be used as the sole basis for treatment or other patient management decisions, including infection  control decisions, particularly in the presence of clinical signs and  symptoms consistent with COVID-19, or in those who have been in contact with the virus.  Negative  results must be combined with clinical observations, patient history, and epidemiological information. The expected result is Negative.  Fact Sheet for Patients: HandmadeRecipes.com.cy  Fact Sheet for Healthcare Providers: FuneralLife.at  This test is not yet approved or cleared by the [place] FDA and  has been authorized for detection and/or diagnosis of SARS-CoV-2 by FDA under an Emergency Use Authorization (EUA).  This EUA will remain in effect (meaning this test can be used) for the duration of  the COV                          ID-19 declaration under Section 564(b)(1) of the Act, 21 U.S.C. section 360bbb-3(b)(1), unless the authorization is terminated or revoked sooner.     Cholesterol 09/30/2022 224 (H)  0 - 200 mg/dL Final   Triglycerides 09/30/2022 67  <150 mg/dL Final   HDL 09/30/2022 >135  >40 mg/dL Final   Total CHOL/HDL Ratio 09/30/2022 NOT CALCULATED  RATIO Final   VLDL 09/30/2022 13  0 - 40 mg/dL Final  LDL Cholesterol 09/30/2022 NOT CALCULATED  0 - 99 mg/dL Final   Performed at [place] [address], [place], [place] [address]  Admission on 09/19/2022, Discharged on 09/20/2022  Component Date Value Ref Range Status   WBC 09/19/2022 6.3  4.0 - 10.5 K/uL Final   RBC 09/19/2022 4.15 (L)  4.22 - 5.81 MIL/uL Final   Hemoglobin 09/19/2022 14.6  13.0 - 17.0 g/dL Final   HCT 09/19/2022 41.7  39.0 - 52.0 % Final   MCV 09/19/2022 100.5 (H)  80.0 - 100.0 fL Final   MCH 09/19/2022 35.2 (H)  26.0 - 34.0 pg Final   MCHC 09/19/2022 35.0  30.0 - 36.0 g/dL Final   RDW 09/19/2022 14.9  11.5 - 15.5 % Final   Platelets 09/19/2022 96 (L)  150 - 400 K/uL Final   nRBC 09/19/2022 0.0  0.0 - 0.2 % Final   Neutrophils Relative % 09/19/2022 55  % Final   Neutro Abs 09/19/2022 3.5  1.7 - 7.7 K/uL Final   Lymphocytes Relative 09/19/2022 31  % Final   Lymphs Abs 09/19/2022 1.9  0.7 - 4.0 K/uL Final   Monocytes Relative  09/19/2022 11  % Final   Monocytes Absolute 09/19/2022 0.7  0.1 - 1.0 K/uL Final   Eosinophils Relative 09/19/2022 1  % Final   Eosinophils Absolute 09/19/2022 0.1  0.0 - 0.5 K/uL Final   Basophils Relative 09/19/2022 1  % Final   Basophils Absolute 09/19/2022 0.1  0.0 - 0.1 K/uL Final   Immature Granulocytes 09/19/2022 1  % Final   Abs Immature Granulocytes 09/19/2022 0.03  0.00 - 0.07 K/uL Final   Performed at [facility], [place], [place] [address]   Color, Urine 09/19/2022 STRAW (A)  YELLOW Final   APPearance 09/19/2022 CLEAR (A)  CLEAR Final   Specific Gravity, Urine 09/19/2022 1.004 (L)  1.005 - 1.030 Final   pH 09/19/2022 5.0  5.0 - 8.0 Final   Glucose, UA 09/19/2022 NEGATIVE  NEGATIVE mg/dL Final   Hgb urine dipstick 09/19/2022 NEGATIVE  NEGATIVE Final   Bilirubin Urine 09/19/2022 NEGATIVE  NEGATIVE Final   Ketones, ur 09/19/2022 NEGATIVE  NEGATIVE mg/dL Final   Protein, ur 09/19/2022 NEGATIVE  NEGATIVE mg/dL Final   Nitrite 09/19/2022 NEGATIVE  NEGATIVE Final   Leukocytes,Ua 09/19/2022 NEGATIVE  NEGATIVE Final   Performed at [facility], [place] [place], [place] [id number]   Tricyclic, Ur Screen [id number] NONE DETECTED  NONE DETECTED Final   Amphetamines, Ur Screen 09/19/2022 NONE DETECTED  NONE DETECTED Final   MDMA (Ecstasy)Ur Screen 09/19/2022 NONE DETECTED  NONE DETECTED Final   Cocaine Metabolite,Ur [place] 09/19/2022 NONE DETECTED  NONE DETECTED Final   Opiate, Ur Screen 09/19/2022 NONE DETECTED  NONE DETECTED Final   Phencyclidine (PCP) Ur S 09/19/2022 NONE DETECTED  NONE DETECTED Final   Cannabinoid 50 Ng, Ur [place] 09/19/2022 NONE DETECTED  NONE DETECTED Final   Barbiturates, Ur Screen 09/19/2022 NONE DETECTED  NONE DETECTED Final   Benzodiazepine, Ur Scrn 09/19/2022 NONE DETECTED  NONE DETECTED Final   Methadone Scn, Ur 09/19/2022 NONE DETECTED  NONE DETECTED Final   Comment: (NOTE) Tricyclics + metabolites, urine    Cutoff 1000  ng/mL Amphetamines + metabolites, urine  Cutoff 1000 ng/mL MDMA (Ecstasy), urine              Cutoff 500 ng/mL Cocaine Metabolite, urine          Cutoff 300 ng/mL Opiate +  metabolites, urine        Cutoff 300 ng/mL Phencyclidine (PCP), urine         Cutoff 25 ng/mL Cannabinoid, urine                 Cutoff 50 ng/mL Barbiturates + metabolites, urine  Cutoff 200 ng/mL Benzodiazepine, urine              Cutoff 200 ng/mL Methadone, urine                   Cutoff 300 ng/mL  The urine drug screen provides only a preliminary, unconfirmed analytical test result and should not be used for non-medical purposes. Clinical consideration and professional judgment should be applied to any positive drug screen result due to possible interfering substances. A more specific alternate chemical method must be used in order to obtain a confirmed analytical result. Gas chromatography / mass spectrometry (GC/MS) is the preferred confirm                          atory method. Performed at [facility], [place], [place] [address]    Troponin I (High Sensitivity) 09/19/2022 6  <18 ng/L Final   Comment: (NOTE) Elevated high sensitivity troponin I (hsTnI) values and significant  changes across serial measurements may suggest ACS but many other  chronic and acute conditions are known to elevate hsTnI results.  Refer to the Links section for chest pain algorithms and additional  guidance. Performed at [facility], [place] [address] [place], [place] [address]    SARS Coronavirus 2 by RT PCR 09/19/2022 NEGATIVE  NEGATIVE Final   Comment: (NOTE) SARS-CoV-2 target nucleic acids are NOT DETECTED.  The SARS-CoV-2 RNA is generally detectable in upper respiratory specimens during the acute phase of infection. The lowest concentration of SARS-CoV-2 viral copies this assay can detect is 138 copies/mL. A negative result does not preclude SARS-Cov-2 infection and should not be used  as the sole basis for treatment or other patient management decisions. A negative result may occur with  improper specimen collection/handling, submission of specimen other than nasopharyngeal swab, presence of viral mutation(s) within the areas targeted by this assay, and inadequate number of viral copies(<138 copies/mL). A negative result must be combined with clinical observations, patient history, and epidemiological information. The expected result is Negative.  Fact Sheet for Patients:  EntrepreneurPulse.com.au  Fact Sheet for Healthcare Providers:  IncredibleEmployment.be  This test is no                          t yet approved or cleared by the [place] FDA and  has been authorized for detection and/or diagnosis of SARS-CoV-2 by FDA under an Emergency Use Authorization (EUA). This EUA will remain  in effect (meaning this test can be used) for the duration of the COVID-19 declaration under Section 564(b)(1) of the Act, 21 U.S.C.section 360bbb-3(b)(1), unless the authorization is terminated  or revoked sooner.       Influenza A by PCR 09/19/2022 NEGATIVE  NEGATIVE Final   Influenza B by PCR 09/19/2022 NEGATIVE  NEGATIVE Final   Comment: (NOTE) The Xpert Xpress SARS-CoV-2/FLU/RSV plus assay is intended as an aid in the diagnosis of influenza from Nasopharyngeal swab specimens and should not be used as a sole basis for treatment. Nasal washings and aspirates are unacceptable for Xpert Xpress SARS-CoV-2/FLU/RSV testing.  Fact Sheet for Patients:  EntrepreneurPulse.com.au  Fact Sheet for Healthcare Providers: IncredibleEmployment.be  This test is not yet approved or cleared by the [place] FDA and has been authorized for detection and/or diagnosis of SARS-CoV-2 by FDA under an Emergency Use Authorization (EUA). This EUA will remain in effect (meaning this test can be used) for the duration of  the COVID-19 declaration under Section 564(b)(1) of the Act, 21 U.S.C. section 360bbb-3(b)(1), unless the authorization is terminated or revoked.     Resp Syncytial Virus by PCR 09/19/2022 NEGATIVE  NEGATIVE Final   Comment: (NOTE) Fact Sheet for Patients: EntrepreneurPulse.com.au  Fact Sheet for Healthcare Providers: IncredibleEmployment.be  This test is not yet approved or cleared by the [place] FDA and has been authorized for detection and/or diagnosis of SARS-CoV-2 by FDA under an Emergency Use Authorization (EUA). This EUA will remain in effect (meaning this test can be used) for the duration of the COVID-19 declaration under Section 564(b)(1) of the Act, 21 U.S.C. section 360bbb-3(b)(1), unless the authorization is terminated or revoked.  Performed at [facility], [place] [place], [place] [address]    Group A Strep by PCR 09/19/2022 NOT DETECTED  NOT DETECTED Final   Performed at [facility], [place], [place] [address]   Sodium 09/19/2022 143  135 - 145 mmol/L Final   Potassium 09/19/2022 3.9  3.5 - 5.1 mmol/L Final   Chloride 09/19/2022 106  98 - 111 mmol/L Final   CO2 09/19/2022 25  22 - 32 mmol/L Final   Glucose, Bld 09/19/2022 83  70 - 99 mg/dL Final   Glucose reference range applies only to samples taken after fasting for at least 8 hours.   BUN 09/19/2022 8  6 - 20 mg/dL Final   Creatinine, Ser 09/19/2022 0.92  0.61 - 1.24 mg/dL Final   Calcium 09/19/2022 7.9 (L)  8.9 - 10.3 mg/dL Final   Total Protein 09/19/2022 7.0  6.5 - 8.1 g/dL Final   Albumin 09/19/2022 3.8  3.5 - 5.0 g/dL Final   AST 09/19/2022 97 (H)  15 - 41 U/L Final   ALT 09/19/2022 48 (H)  0 - 44 U/L Final   Alkaline Phosphatase 09/19/2022 60  38 - 126 U/L Final   Total Bilirubin 09/19/2022 0.9  0.3 - 1.2 mg/dL Final   GFR, Estimated 09/19/2022 >60  >60 mL/min Final   Comment: (NOTE) Calculated using the CKD-EPI  Creatinine Equation (2021)    Anion gap 09/19/2022 12  5 - 15 Final   Performed at [facility], [place] [place], [place] [address]   Lipase 09/19/2022 51  11 - 51 U/L Final   Performed at [facility], [address] [place], [place] [address]   Troponin I (High Sensitivity) 09/19/2022 8  <18 ng/L Final   Comment: (NOTE) Elevated high sensitivity troponin I (hsTnI) values and significant  changes across serial measurements may suggest ACS but many other  chronic and acute conditions are known to elevate hsTnI results.  Refer to the "Links" section for chest pain algorithms and additional  guidance. Performed at [facility], [address] [place], [place] [address]   Admission on 09/15/2022, Discharged on 09/15/2022  Component Date Value Ref Range Status   WBC 09/15/2022 4.9  4.0 - 10.5 K/uL Final   RBC 09/15/2022 4.09 (L)  4.22 - 5.81 MIL/uL Final   Hemoglobin 09/15/2022 14.5  13.0 - 17.0 g/dL Final   HCT 09/15/2022 39.9  39.0 - 52.0 %  Final   MCV 09/15/2022 97.6  80.0 - 100.0 fL Final   MCH 09/15/2022 35.5 (H)  26.0 - 34.0 pg Final   MCHC 09/15/2022 36.3 (H)  30.0 - 36.0 g/dL Final   RDW 09/15/2022 13.9  11.5 - 15.5 % Final   Platelets 09/15/2022 102 (L)  150 - 400 K/uL Final   nRBC 09/15/2022 0.0  0.0 - 0.2 % Final   Neutrophils Relative % 09/15/2022 37  % Final   Neutro Abs 09/15/2022 1.8  1.7 - 7.7 K/uL Final   Lymphocytes Relative 09/15/2022 49  % Final   Lymphs Abs 09/15/2022 2.4  0.7 - 4.0 K/uL Final   Monocytes Relative 09/15/2022 13  % Final   Monocytes Absolute 09/15/2022 0.6  0.1 - 1.0 K/uL Final   Eosinophils Relative 09/15/2022 0  % Final   Eosinophils Absolute 09/15/2022 0.0  0.0 - 0.5 K/uL Final   Basophils Relative 09/15/2022 1  % Final   Basophils Absolute 09/15/2022 0.1  0.0 - 0.1 K/uL Final   Immature Granulocytes 09/15/2022 0  % Final   Abs Immature Granulocytes 09/15/2022 0.00  0.00 - 0.07 K/uL Final   Performed  at [facility], [place], [place] [address]   Sodium 09/15/2022 136  135 - 145 mmol/L Final   Potassium 09/15/2022 3.7  3.5 - 5.1 mmol/L Final   Chloride 09/15/2022 99  98 - 111 mmol/L Final   CO2 09/15/2022 23  22 - 32 mmol/L Final   Glucose, Bld 09/15/2022 107 (H)  70 - 99 mg/dL Final   Glucose reference range applies only to samples taken after fasting for at least 8 hours.   BUN 09/15/2022 7  6 - 20 mg/dL Final   Creatinine, Ser 09/15/2022 0.83  0.61 - 1.24 mg/dL Final   Calcium 09/15/2022 8.5 (L)  8.9 - 10.3 mg/dL Final   Total Protein 09/15/2022 7.7  6.5 - 8.1 g/dL Final   Albumin 09/15/2022 4.1  3.5 - 5.0 g/dL Final   AST 09/15/2022 89 (H)  15 - 41 U/L Final   ALT 09/15/2022 54 (H)  0 - 44 U/L Final   Alkaline Phosphatase 09/15/2022 67  38 - 126 U/L Final   Total Bilirubin 09/15/2022 1.2  0.3 - 1.2 mg/dL Final   GFR, Estimated 09/15/2022 >60  >60 mL/min Final   Comment: (NOTE) Calculated using the CKD-EPI Creatinine Equation (2021)    Anion gap 09/15/2022 14  5 - 15 Final   Performed at [facility], [place], [place] [address]   Alcohol, Ethyl (B) 09/15/2022 313 (HH)  <10 mg/dL Final   Comment: CRITICAL RESULT CALLED TO, READ BACK BY AND VERIFIED WITH AMBER PAYNE AT 1722 ON 09/15/22 BY SS (NOTE) Lowest detectable limit for serum alcohol is 10 mg/dL.  For medical purposes only. Performed at [facility], [place],  [address]    Color, Urine 09/15/2022 YELLOW (A)  YELLOW Final   APPearance 09/15/2022 CLEAR (A)  CLEAR Final   Specific Gravity, Urine 09/15/2022 1.005  1.005 - 1.030 Final   pH 09/15/2022 6.0  5.0 - 8.0 Final   Glucose, UA 09/15/2022 NEGATIVE  NEGATIVE mg/dL Final   Hgb urine dipstick 09/15/2022 NEGATIVE  NEGATIVE Final   Bilirubin Urine 09/15/2022 NEGATIVE  NEGATIVE Final   Ketones, ur 09/15/2022 NEGATIVE  NEGATIVE mg/dL Final   Protein, ur 09/15/2022 NEGATIVE  NEGATIVE mg/dL  Final   Nitrite 09/15/2022 NEGATIVE  NEGATIVE Final  Leukocytes,Ua 09/15/2022 NEGATIVE  NEGATIVE Final   Performed at [facility], [place], [place] [id number]   Tricyclic, Ur Screen 0000000 NONE DETECTED  NONE DETECTED Final   Amphetamines, Ur Screen 09/15/2022 NONE DETECTED  NONE DETECTED Final   MDMA (Ecstasy)Ur Screen 09/15/2022 NONE DETECTED  NONE DETECTED Final   Cocaine Metabolite,Ur [place] 09/15/2022 NONE DETECTED  NONE DETECTED Final   Opiate, Ur Screen 09/15/2022 NONE DETECTED  NONE DETECTED Final   Phencyclidine (PCP) Ur S 09/15/2022 NONE DETECTED  NONE DETECTED Final   Cannabinoid 50 Ng, Ur Joseph 09/15/2022 NONE DETECTED  NONE DETECTED Final   Barbiturates, Ur Screen 09/15/2022 NONE DETECTED  NONE DETECTED Final   Benzodiazepine, Ur Scrn 09/15/2022 NONE DETECTED  NONE DETECTED Final   Methadone Scn, Ur 09/15/2022 NONE DETECTED  NONE DETECTED Final   Comment: (NOTE) Tricyclics + metabolites, urine    Cutoff 1000 ng/mL Amphetamines + metabolites, urine  Cutoff 1000 ng/mL MDMA (Ecstasy), urine              Cutoff 500 ng/mL Cocaine Metabolite, urine          Cutoff 300 ng/mL Opiate + metabolites, urine        Cutoff 300 ng/mL Phencyclidine (PCP), urine         Cutoff 25 ng/mL Cannabinoid, urine                 Cutoff 50 ng/mL Barbiturates + metabolites, urine  Cutoff 200 ng/mL Benzodiazepine, urine              Cutoff 200 ng/mL Methadone, urine                   Cutoff 300 ng/mL  The urine drug screen provides only a preliminary, unconfirmed analytical test result and should not be used for non-medical purposes. Clinical consideration and professional judgment should be applied to any positive drug screen result due to possible interfering substances. A more specific alternate chemical method must be used in order to obtain a confirmed analytical result. Gas chromatography / mass spectrometry (GC/MS) is the preferred confirm                           atory method. Performed at [facility], [address] [place], [place] [address]     Blood Alcohol level:  Lab Results  Component Value Date   ETH 214 (H) 09/30/2022   ETH 313 (HH) 0000000    Metabolic Disorder Labs: Lab Results  Component Value Date   HGBA1C 5.4 09/30/2022   MPG 108 09/30/2022   No results found for: "PROLACTIN" Lab Results  Component Value Date   CHOL 224 (H) 09/30/2022   TRIG 67 09/30/2022   HDL >135 09/30/2022   CHOLHDL NOT CALCULATED 09/30/2022   VLDL 13 09/30/2022   LDLCALC NOT CALCULATED 09/30/2022    Therapeutic Lab Levels: No results found for: "LITHIUM" No results found for: "VALPROATE" No results found for: "CBMZ"  Physical Findings   PHQ2-9    Flowsheet Row ED from 09/30/2022 in [facility]  PHQ-2 Total Score 6  PHQ-9 Total Score 25      Flowsheet Row ED from 09/30/2022 in [facility] ED from 09/19/2022 in [facility] Emergency Department at [facility] ED from 09/15/2022 in [facility] Emergency Department at [place] No Risk Low Risk No Risk  Musculoskeletal  Strength & Muscle Tone: within normal limits Gait & Station: normal Patient leans: N/A  Psychiatric Specialty Exam  Presentation General Appearance: Appropriate for Environment  Eye Contact:Fair  Speech:Clear and Coherent  Speech Volume:Normal  Handedness:-- (not assessed)   Mood and Affect  Mood:Euthymic  Affect:Congruent   Thought Process  Thought Processes:Coherent; Linear  Descriptions of Associations:Intact  Orientation:Full (Time, Place and Person)  Thought Content:Logical    Hallucinations:Hallucinations: None  Ideas of Reference:None  Suicidal Thoughts:Suicidal Thoughts: No  Homicidal Thoughts:Homicidal Thoughts: No   Sensorium  Memory:Immediate Fair; Recent Fair; Remote  Fair  Judgment:Fair  Insight:Fair   Executive Functions  Concentration:Fair  Attention Span:Fair  [place]   Psychomotor Activity  Psychomotor Activity:Psychomotor Activity: Normal   Assets  Assets:Communication Skills; Resilience   Sleep  Sleep:Sleep: Fair   Nutritional Assessment (For OBS and FBC admissions only) Has the patient had a weight loss or gain of 10 pounds or more in the last 3 months?: No Has the patient had a decrease in food intake/or appetite?: Yes Does the patient have dental problems?: No Does the patient have eating habits or behaviors that may be indicators of an eating disorder including binging or inducing vomiting?: No Has the patient recently lost weight without trying?: 0 Has the patient been eating poorly because of a decreased appetite?: 0 Malnutrition Screening Tool Score: 0    Physical Exam Constitutional:      Appearance: the patient is not toxic-appearing.  Pulmonary:     Effort: Pulmonary effort is normal.  Neurological:     General: No focal deficit present.     Mental Status: the patient is alert and oriented to person, place, and time.   Review of Systems  Respiratory:  Negative for shortness of breath.   Cardiovascular:  Negative for chest pain.  Gastrointestinal:  Negative for abdominal pain, constipation, diarrhea, nausea and vomiting.  Neurological:  Negative for headaches.    BP (!) 160/93 (BP Location: Right Arm)   Pulse 84   Temp 98.3 F (36.8 C) (Tympanic)   Resp 16   SpO2 100%   Assessment and Plan:  Status: Voluntary, no acute safety concerns given lack of previous suicidality and patient contracting for safety  Alcohol use disorder, severe, active withdrawal, no history of severe withdrawal - Ativan taper with gabapentin, CIWA scoring with as needed Ativan - As needed trazodone for sleep - Patient will engage in detox and consider residential  rehab  Medical: - Routine labs notable for decreased platelets and increased T. bili consistent with impaired hepatic function - Thiamine - Hypertension and tachycardia, suspect secondary to withdrawal    Corky Sox, MD 10/02/2022 1:33 PM

## 2022-10-02 NOTE — Discharge Instructions (Addendum)
PCP Providers in [place], [place] [phone] [place]: [phone]  List of Residential placements:   [facility]: [phone]  Benita Stabile, [place]: Male and male facility; 30-day program: (uninsured and Medicaid such as [facility]oria [facility], [place], [place], partners)  [place]: [phone]; men and women's facility; 28 days; Can have Medicaid tailored plan Publishing rights manager or Partners)  [place]: [phone] Janace Hoard or Jeani Hawking; 28 day program; must be fully detox; tailored Medicaid or no insurance  [place] in [place], [place]; [phone]; 28 day all males program; no insurance accepted  [facility] Referral in [place]: Wille Glaser [phone] (no insurance or Medicaid only); 90 days; outpatient services but provide housing in apartments downtown [place] [facility]: [phone]: Patient must complete phone screening for placement: [place], [place]; 6 month program; uninsured, Medicaid, and [employer].   [facility]: no insurance required; [place]: [phone]; Intake: Herbie Baltimore; Must fill out application online; Christy Sartorius Delay [phone] x [facility] in [place], [place]: [phone]; Admissions Coordinators Mr. Simona Huh or Jene Every; 90 day program.  [facility]: [place], [place] [phone]; Co-Ed 9 month to a year program; Online application; Men entry fee is $500 (6-60months);  [facility]: [address] [place], [place] [address]; no fee or insurance required; minimum of 2 years; Highly structured; work based; Intake Coordinator is Gerald Stabs [phone]  [facility] in [place], [place]: [phone]; Fax number is [phone]; website: [email or web address]; Requires 3-6 page autobiography; 2 year program (18 months and then 52month transitional  housing); Admission fee is $300; no insurance needed; work Facilities manager in [place], [place]: [place] Staff: Michel Bickers [phone]: They have a Men's Regenerations Program 6-81months. Free program; There is an initial $300 fee however, they are willing to work with patients regarding that. Application is online.  [facility]: Admissions [place] ext [phone]; Any 7-90 day program is out of pocket; 12 month program is free of charge; there is a $275 entry fee; Patient is responsible for own transportation      [facility] [place], [place], [address] [phone] phone  New Patient Assessment/Therapy Walk-Ins:  Monday and Wednesday: 8 am until slots are full. Every 1st and 2nd Fridays of the month: 1 pm - 5 pm.  NO ASSESSMENT/THERAPY WALK-INS ON [place]  New Patient Assessment/Medication Management Walk-Ins:  Monday - Friday:  8 am - 11 am.  For all walk-ins, we ask that you arrive by 7:30 am because patients will be seen in the order of arrival.  Availability is limited; therefore, you may not be seen on the same day that you walk-in.  Our goal is to serve and meet the needs of our community to the best of our ability.  SUBSTANCE USE TREATMENT for Medicaid and State Funded/IPRS  [facility] [place], [place], [address] [phone] phone NOTE: [facility] is no longer offering IOP services.  Serves those who are low-income or have no insurance.  [facility] [address] [place], [place], [address] [phone] phone [phone] fax NOTE: Does have Substance Abuse-Intensive Outpatient Program [facility]) as well as transitional housing if eligible.  [place], [place], [address] [phone] phone [phone] fax  [place] [phone] [place], [place], [address] [phone] phone [phone]  fax  HALFWAY HOUSES:  [facility] [phone]  [employer].oxfordvacancies.com  12 STEP PROGRAMS:  Alcoholics Anonymous of [place] ReportZoo.com.cy  Narcotics Anonymous of [place] GreenScrapbooking.dk  Al-Anon of [employer], [place] www.[place]alanon.org/find-meetings.html  Nar-Anon https://nar-anon.org/find-a-meetin

## 2022-10-02 NOTE — ED Notes (Signed)
Patient participated in RN led group topic was Suicide Safety Plan. Patient actively participated.

## 2022-10-02 NOTE — Discharge Planning (Signed)
LCSW was provided an update from MD that patient reports that he is not interested in placement outside of [facility] due to his mother being sick. LCSW went and spoke with patient. Patient asked if [facility] was still open as he has been to that facility in the past. LCSW informed patient that LCSW will research to see if facility is still open and provide updates as received.   LCSW researched company and it appears to be [facility] in [place]. LCSW contact Admissions Coordinator Milbert Coulter to inquire about bed availability. Per Milbert Coulter, he would need to set up a phone screening with the patient and the earliest time is Monday at 11:30am. Next steps would be to set up a face to face zoom meeting if the patient is appropriate. Agency is a 6 month to a year program. Milbert Coulter reports he currently has 5 beds available, however they go quickly. Per Milbert Coulter, there is no quick fix to placement, however these steps can be taken in order for the process to move smoothly for admission. LCSW spoke with patient regarding his interest and patient is agreeable to plan. LCSW also explored if patient would be interested in [place] in [facility] until placement becomes available if beds fill up. Patient expressed interested in [place] as follow up plan. No other needs were reported at this time.   Interview has been scheduled with [facility] for Monday 10/06/22 at 11:30AM.   Lucius Conn, [place] Worker [place] [facility] Ph: [phone]

## 2022-10-02 NOTE — ED Notes (Signed)
Patient in dayroom watching TV with peers. Respirations equal and unlabored, skin warm and dry, NAD. No change in assessment or acuity. Q 15 minute safety checks remain in place.

## 2022-10-02 NOTE — ED Notes (Signed)
Pt was in bathroom. Alert and orient x 4. Will continue to monitor for safety

## 2022-10-02 NOTE — ED Notes (Signed)
Pt in dayroom watching television. No c/o pain or distress. Alert and orient x 5. Will continue to monitor for safety

## 2022-10-02 NOTE — Group Note (Signed)
Group Topic: Communication  Group Date: 10/02/2022 Start Time: 1000 End Time: 1025 Facilitators: Ferdinand Cava  Department: [facility]  Number of Participants: 5  Group Focus: social skills Treatment Modality:  Psychoeducation and Skills Training Interventions utilized were clarification and group exercise Purpose: improve communication skills  Name: Ryan Petty Date of Birth: 1966-05-11  MR: [id number]    Level of Participation: active Quality of Participation: attentive, cooperative, engaged, and offered feedback Interactions with others: gave feedback Mood/Affect: appropriate Triggers (if applicable): N/A Cognition: coherent/clear and logical Progress: Moderate Response: N/A Plan: follow-up needed  Patients Problems:  Patient Active Problem List   Diagnosis Date Noted   ETOH abuse 09/20/2022   Head injury 09/20/2022   MDD (major depressive disorder), recurrent episode, moderate ([place]) 09/19/2022   Alcohol abuse with intoxication ([place]) 09/19/2022   Supraglottitis 11/12/2021   Laryngitis, acute 11/12/2021   Nicotine dependence 11/12/2021   Hepatitis C without hepatic coma 01/13/2019

## 2022-10-02 NOTE — ED Notes (Signed)
Patient is sleeping. Respirations equal and unlabored, skin warm and dry, NAD. No change in assessment or acuity. Routine safety checks conducted according to facility protocol. Will continue to monitor for safety.

## 2022-10-02 NOTE — ED Notes (Signed)
Patient in dayroom watching TV. Respirations equal and unlabored, skin warm and dry, NAD. No change in assessment or acuity. Q 15 minute safety checks remain in place.

## 2022-10-03 DIAGNOSIS — R5383 Other fatigue: Secondary | ICD-10-CM | POA: Diagnosis not present

## 2022-10-03 DIAGNOSIS — R200 Anesthesia of skin: Secondary | ICD-10-CM | POA: Diagnosis not present

## 2022-10-03 DIAGNOSIS — Z1152 Encounter for screening for COVID-19: Secondary | ICD-10-CM | POA: Diagnosis not present

## 2022-10-03 DIAGNOSIS — F10229 Alcohol dependence with intoxication, unspecified: Secondary | ICD-10-CM | POA: Diagnosis not present

## 2022-10-03 LAB — PROTIME-INR
INR: 0.9 (ref 0.8–1.2)
Prothrombin Time: 11.7 seconds (ref 11.4–15.2)

## 2022-10-03 LAB — HEPATITIS PANEL, ACUTE
HCV Ab: REACTIVE — AB
Hep A IgM: NONREACTIVE
Hep B C IgM: NONREACTIVE
Hepatitis B Surface Ag: NONREACTIVE

## 2022-10-03 NOTE — Progress Notes (Signed)
Pt's CIWA was 2. °

## 2022-10-03 NOTE — Progress Notes (Signed)
Pt is awake, alert and oriented X4. Pt did not voice any complaints of pain or discomfort. No signs of acute distress noted. Administered scheduled meds with no issue. Pt denies current SI/HI/AVH, plan or intent. Staff will monitor for pt's safety.

## 2022-10-03 NOTE — ED Notes (Signed)
Pt is in the dayroom watching TV with peers. Pt denies SI/HI/AVH. No acute distress noted. Will continue to monitor for safety. 

## 2022-10-03 NOTE — ED Notes (Signed)
Pt is attending AA 

## 2022-10-03 NOTE — ED Provider Notes (Signed)
Behavioral Health Progress Note  Date and Time: 10/03/2022 3:41 PM Name: Ryan Petty MRN:  [id number]  Subjective:   On assessment 3/8, the patient is calm.  He has been participating well in group and denies withdrawal symptoms.  LCSW states that the patient has an interview with [facility] on Monday.  Possible Zoom follow-up on Tuesday.  If patient is not accepted to [facility] for residential rehab, patient can follow-up with [facility], they offer CD IOP programming.  The patient denies auditory/visual hallucinations.  The patient reports good mood, appetite, and sleep. They deny suicidal and homicidal thoughts. The patient denies side effects from their medications.  Review of systems as below. The patient denies experiencing any withdrawal symptoms.   On assessment 3/7, the patient reports that he is doing fairly well.  He reports that he wants to stay in [place].  Discussed that there is a potential rehab option in [place].  The patient declines.  LCSW states there are no [facility] with beds available in [place].  On assessment 3/6, patient appears calm and alert, a significant improvement from yesterday.  He reports sleeping for a few hours.  Discussed with him that we will try to find him residential rehab at [facility] or [facility], but that both of these are only possibilities.  The patient reports that he is willing to work a job and is willing to consider [place] houses and sober living arrangements.  The patient has lab values consistent with liver dysfunction.  His platelets are 126 and his T. bili is 1.5, slight elevation in LFTs.  Discussed with him that he needs to follow-up with a primary care doctor.  Will ask if LCSW can provide recommendations.  The patient denies auditory/visual hallucinations.  The patient reports good mood, appetite, and sleep. They deny suicidal and homicidal thoughts. The patient denies side effects from their medications.  Review of systems as below. The patient denies  experiencing any withdrawal symptoms.    Information obtained on initial evaluation: The patient is a 57 year old male with little past psychiatric history, no documented behavioral health admissions.  He presented to the behavioral urgent care requesting help with alcohol use.  He was admitted to the facility based crisis.  On my evaluation 3/5, the patient appears uncomfortable and is rocking back and forth.  As soon as I sit down he says "can I have something to go to sleep".  The patient says he is exhausted and that only alcohol helps him go to sleep.  He states that he thinks either about his girlfriend who died 1 year ago or "nothing".  He denies experiencing hallucinations.  His only physical complaint is numbness in his feet.  The patient reports that he was in prison for 7.5 years for felony gun possession.  He states that he was released in 2019 and since that time has been living with his mother, who is now dying from cancer.  He states that his girlfriend died 1 year ago and that this was traumatic for him.  Since that time he has had trouble with alcohol use.  He reports that he drinks 2 or 3, 40 ounce beers per day.  His last drink was this morning.  He states that he feels shaky if he does not have a drink of alcohol for an extended period of time.  He denies ever experiencing a seizure or DTs.  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  He states that he occasionally uses crack cocaine or THC.  He states that he has a friend named Elaine/Tara who drove him here to get help.  He also states that he has a new girlfriend who uses crack frequently.  The patient reports experiencing depression and says that he has had suicidal thoughts with no plan.  He contracts for safety.  Diagnosis:  Final diagnoses:  Alcohol abuse with intoxication ([place])    Total Time spent with patient: 20  minutes  Past Psychiatric History: as above Past Medical History: as above Family History: none Family Psychiatric  History: none Social History: as above and per H and P  Additional Social History:  See H and P                  Sleep: Fair  Appetite:  Fair   Current Medications:  Current Facility-Administered Medications  Medication Dose Route Frequency Provider Last Rate Last Admin   acetaminophen (TYLENOL) tablet 650 mg  650 mg Oral Q6H PRN Tharon Aquas, NP       alum & mag hydroxide-simeth (MAALOX/MYLANTA) 200-200-20 MG/5ML suspension 30 mL  30 mL Oral Q4H PRN Tharon Aquas, NP       gabapentin (NEURONTIN) capsule 200 mg  200 mg Oral BID Corky Sox, MD   200 mg at 10/03/22 0848   magnesium hydroxide (MILK OF MAGNESIA) suspension 30 mL  30 mL Oral Daily PRN Tharon Aquas, NP       multivitamin with minerals tablet 1 tablet  1 tablet Oral Daily Corky Sox, MD   1 tablet at 10/03/22 0848   nicotine (NICODERM CQ - dosed in mg/24 hours) patch 21 mg  21 mg Transdermal Daily Tharon Aquas, NP   21 mg at 10/03/22 0848   thiamine (VITAMIN B1) tablet 100 mg  100 mg Oral Daily Corky Sox, MD   100 mg at 10/03/22 0848   traZODone (DESYREL) tablet 50 mg  50 mg Oral QHS PRN Corky Sox, MD   50 mg at 10/02/22 2110   No current outpatient medications on file.    Labs  Lab Results:  Admission on 09/30/2022  Component Date Value Ref Range Status   SARS Coronavirus 2 by RT PCR 09/30/2022 NEGATIVE  NEGATIVE Final   Influenza A by PCR 09/30/2022 NEGATIVE  NEGATIVE Final   Influenza B by PCR 09/30/2022 NEGATIVE  NEGATIVE Final   Comment: (NOTE) The Xpert Xpress SARS-CoV-2/FLU/RSV plus assay is intended as an aid in the diagnosis of influenza from Nasopharyngeal swab specimens and should not be used as a sole basis for treatment. Nasal washings and aspirates are unacceptable for Xpert Xpress SARS-CoV-2/FLU/RSV testing.  Fact Sheet for  Patients: EntrepreneurPulse.com.au  Fact Sheet for Healthcare Providers: IncredibleEmployment.be  This test is not yet approved or cleared by the [place] FDA and has been authorized for detection and/or diagnosis of SARS-CoV-2 by FDA under an Emergency Use Authorization (EUA). This EUA will remain in effect (meaning this test can be used) for the duration of the COVID-19 declaration under Section 564(b)(1) of the Act, 21 U.S.C. section 360bbb-3(b)(1), unless the authorization is terminated or revoked.     Resp Syncytial Virus by PCR 09/30/2022 NEGATIVE  NEGATIVE Final   Comment: (NOTE) Fact Sheet for Patients: EntrepreneurPulse.com.au  Fact Sheet for Healthcare Providers: IncredibleEmployment.be  This test is not yet approved or cleared by the [place] and  has been authorized for detection and/or diagnosis of SARS-CoV-2 by FDA under an Emergency Use Authorization (EUA). This EUA will remain in effect (meaning this test can be used) for the duration of the COVID-19 declaration under Section 564(b)(1) of the Act, 21 U.S.C. section 360bbb-3(b)(1), unless the authorization is terminated or revoked.  Performed at [facility], [place] [address], [place], [place] [address]    WBC 09/30/2022 4.4  4.0 - 10.5 K/uL Final   RBC 09/30/2022 4.11 (L)  4.22 - 5.81 MIL/uL Final   Hemoglobin 09/30/2022 14.7  13.0 - 17.0 g/dL Final   HCT 09/30/2022 39.9  39.0 - 52.0 % Final   MCV 09/30/2022 97.1  80.0 - 100.0 fL Final   MCH 09/30/2022 35.8 (H)  26.0 - 34.0 pg Final   MCHC 09/30/2022 36.8 (H)  30.0 - 36.0 g/dL Final   RDW 09/30/2022 15.7 (H)  11.5 - 15.5 % Final   Platelets 09/30/2022 126 (L)  150 - 400 K/uL Final   nRBC 09/30/2022 0.0  0.0 - 0.2 % Final   Neutrophils Relative % 09/30/2022 51  % Final   Neutro Abs 09/30/2022 2.2  1.7 - 7.7 K/uL Final   Lymphocytes Relative 09/30/2022 36  % Final    Lymphs Abs 09/30/2022 1.6  0.7 - 4.0 K/uL Final   Monocytes Relative 09/30/2022 11  % Final   Monocytes Absolute 09/30/2022 0.5  0.1 - 1.0 K/uL Final   Eosinophils Relative 09/30/2022 1  % Final   Eosinophils Absolute 09/30/2022 0.0  0.0 - 0.5 K/uL Final   Basophils Relative 09/30/2022 1  % Final   Basophils Absolute 09/30/2022 0.1  0.0 - 0.1 K/uL Final   Immature Granulocytes 09/30/2022 0  % Final   Abs Immature Granulocytes 09/30/2022 0.00  0.00 - 0.07 K/uL Final   Performed at [facility], [place] [address], [place], [place] [address]   Sodium 09/30/2022 140  135 - 145 mmol/L Final   Potassium 09/30/2022 4.1  3.5 - 5.1 mmol/L Final   Chloride 09/30/2022 100  98 - 111 mmol/L Final   CO2 09/30/2022 24  22 - 32 mmol/L Final   Glucose, Bld 09/30/2022 71  70 - 99 mg/dL Final   Glucose reference range applies only to samples taken after fasting for at least 8 hours.   BUN 09/30/2022 8  6 - 20 mg/dL Final   Creatinine, Ser 09/30/2022 1.01  0.61 - 1.24 mg/dL Final   Calcium 09/30/2022 8.5 (L)  8.9 - 10.3 mg/dL Final   Total Protein 09/30/2022 7.5  6.5 - 8.1 g/dL Final   Albumin 09/30/2022 4.2  3.5 - 5.0 g/dL Final   AST 09/30/2022 155 (H)  15 - 41 U/L Final   ALT 09/30/2022 78 (H)  0 - 44 U/L Final   Alkaline Phosphatase 09/30/2022 73  38 - 126 U/L Final   Total Bilirubin 09/30/2022 1.5 (H)  0.3 - 1.2 mg/dL Final   GFR, Estimated 09/30/2022 >60  >60 mL/min Final   Comment: (NOTE) Calculated using the CKD-EPI Creatinine Equation (2021)    Anion gap 09/30/2022 16 (H)  5 - 15 Final   Performed at [facility], [place] [address] [place], [place] [address]   Hgb A1c MFr Bld 09/30/2022 5.4  4.8 - 5.6 % Final   Comment: (NOTE)         Prediabetes: 5.7 - 6.4         Diabetes: >6.4  Glycemic control for adults with diabetes: <7.0    Mean Plasma Glucose 09/30/2022 108  mg/dL Final   Comment: (NOTE) Performed At: [facility] [place], [place]  [id number] Rush Farmer MD [id number]    Alcohol, Ethyl (B) 09/30/2022 214 (H)  <10 mg/dL Final   Comment: (NOTE) Lowest detectable limit for serum alcohol is 10 mg/dL.  For medical purposes only. Performed at [facility], [place] [address] [place], [place] [address]    TSH 09/30/2022 0.590  0.350 - 4.500 uIU/mL Final   Comment: Performed by a 3rd Generation assay with a functional sensitivity of <=0.01 uIU/mL. Performed at [facility], [place] [address] [place], [place] [address]    POC Amphetamine UR 09/30/2022 None Detected  NONE DETECTED (Cut Off Level 1000 ng/mL) Final   POC Secobarbital (BAR) 09/30/2022 None Detected  NONE DETECTED (Cut Off Level 300 ng/mL) Final   POC Buprenorphine (BUP) 09/30/2022 None Detected  NONE DETECTED (Cut Off Level 10 ng/mL) Final   POC Oxazepam (BZO) 09/30/2022 None Detected  NONE DETECTED (Cut Off Level 300 ng/mL) Final   POC Cocaine UR 09/30/2022 None Detected  NONE DETECTED (Cut Off Level 300 ng/mL) Final   POC Methamphetamine UR 09/30/2022 None Detected  NONE DETECTED (Cut Off Level 1000 ng/mL) Final   POC Morphine 09/30/2022 None Detected  NONE DETECTED (Cut Off Level 300 ng/mL) Final   POC Methadone UR 09/30/2022 None Detected  NONE DETECTED (Cut Off Level 300 ng/mL) Final   POC Oxycodone UR 09/30/2022 None Detected  NONE DETECTED (Cut Off Level 100 ng/mL) Final   POC Marijuana UR 09/30/2022 Positive (A)  NONE DETECTED (Cut Off Level 50 ng/mL) Final   SARSCOV2ONAVIRUS 2 AG 09/30/2022 NEGATIVE  NEGATIVE Final   Comment: (NOTE) SARS-CoV-2 antigen NOT DETECTED.   Negative results are presumptive.  Negative results do not preclude SARS-CoV-2 infection and should not be used as the sole basis for treatment or other patient management decisions, including infection  control decisions, particularly in the presence of clinical signs and  symptoms consistent with COVID-19, or in those who have been in contact with the virus.  Negative  results must be combined with clinical observations, patient history, and epidemiological information. The expected result is Negative.  Fact Sheet for Patients: HandmadeRecipes.com.cy  Fact Sheet for Healthcare Providers: FuneralLife.at  This test is not yet approved or cleared by the [place] FDA and  has been authorized for detection and/or diagnosis of SARS-CoV-2 by FDA under an Emergency Use Authorization (EUA).  This EUA will remain in effect (meaning this test can be used) for the duration of  the COV                          ID-19 declaration under Section 564(b)(1) of the Act, 21 U.S.C. section 360bbb-3(b)(1), unless the authorization is terminated or revoked sooner.     Cholesterol 09/30/2022 224 (H)  0 - 200 mg/dL Final   Triglycerides 09/30/2022 67  <150 mg/dL Final   HDL 09/30/2022 >135  >40 mg/dL Final   Total CHOL/HDL Ratio 09/30/2022 NOT CALCULATED  RATIO Final   VLDL 09/30/2022 13  0 - 40 mg/dL Final   LDL Cholesterol 09/30/2022 NOT CALCULATED  0 - 99 mg/dL Final   Performed at [facility] [address] [place], [place] [address]  Admission on 09/19/2022, Discharged on 09/20/2022  Component Date Value Ref Range Status   WBC  09/19/2022 6.3  4.0 - 10.5 K/uL Final   RBC 09/19/2022 4.15 (L)  4.22 - 5.81 MIL/uL Final   Hemoglobin 09/19/2022 14.6  13.0 - 17.0 g/dL Final   HCT 09/19/2022 41.7  39.0 - 52.0 % Final   MCV 09/19/2022 100.5 (H)  80.0 - 100.0 fL Final   MCH 09/19/2022 35.2 (H)  26.0 - 34.0 pg Final   MCHC 09/19/2022 35.0  30.0 - 36.0 g/dL Final   RDW 09/19/2022 14.9  11.5 - 15.5 % Final   Platelets 09/19/2022 96 (L)  150 - 400 K/uL Final   nRBC 09/19/2022 0.0  0.0 - 0.2 % Final   Neutrophils Relative % 09/19/2022 55  % Final   Neutro Abs 09/19/2022 3.5  1.7 - 7.7 K/uL Final   Lymphocytes Relative 09/19/2022 31  % Final   Lymphs Abs 09/19/2022 1.9  0.7 - 4.0 K/uL Final   Monocytes Relative  09/19/2022 11  % Final   Monocytes Absolute 09/19/2022 0.7  0.1 - 1.0 K/uL Final   Eosinophils Relative 09/19/2022 1  % Final   Eosinophils Absolute 09/19/2022 0.1  0.0 - 0.5 K/uL Final   Basophils Relative 09/19/2022 1  % Final   Basophils Absolute 09/19/2022 0.1  0.0 - 0.1 K/uL Final   Immature Granulocytes 09/19/2022 1  % Final   Abs Immature Granulocytes 09/19/2022 0.03  0.00 - 0.07 K/uL Final   Performed at [facility], [place] [place], [place] [address]   Color, Urine 09/19/2022 STRAW (A)  YELLOW Final   APPearance 09/19/2022 CLEAR (A)  CLEAR Final   Specific Gravity, Urine 09/19/2022 1.004 (L)  1.005 - 1.030 Final   pH 09/19/2022 5.0  5.0 - 8.0 Final   Glucose, UA 09/19/2022 NEGATIVE  NEGATIVE mg/dL Final   Hgb urine dipstick 09/19/2022 NEGATIVE  NEGATIVE Final   Bilirubin Urine 09/19/2022 NEGATIVE  NEGATIVE Final   Ketones, ur 09/19/2022 NEGATIVE  NEGATIVE mg/dL Final   Protein, ur 09/19/2022 NEGATIVE  NEGATIVE mg/dL Final   Nitrite 09/19/2022 NEGATIVE  NEGATIVE Final   Leukocytes,Ua 09/19/2022 NEGATIVE  NEGATIVE Final   Performed at [facility], [place] [place],  [id number]   Tricyclic, Ur Screen [id number] NONE DETECTED  NONE DETECTED Final   Amphetamines, Ur Screen 09/19/2022 NONE DETECTED  NONE DETECTED Final   MDMA (Ecstasy)Ur Screen 09/19/2022 NONE DETECTED  NONE DETECTED Final   Cocaine Metabolite,Ur Two [place] 09/19/2022 NONE DETECTED  NONE DETECTED Final   Opiate, Ur Screen 09/19/2022 NONE DETECTED  NONE DETECTED Final   Phencyclidine (PCP) Ur S 09/19/2022 NONE DETECTED  NONE DETECTED Final   Cannabinoid 50 Ng, Ur [place] 09/19/2022 NONE DETECTED  NONE DETECTED Final   Barbiturates, Ur Screen 09/19/2022 NONE DETECTED  NONE DETECTED Final   Benzodiazepine, Ur Scrn 09/19/2022 NONE DETECTED  NONE DETECTED Final   Methadone Scn, Ur 09/19/2022 NONE DETECTED  NONE DETECTED Final   Comment: (NOTE) Tricyclics + metabolites, urine    Cutoff 1000  ng/mL Amphetamines + metabolites, urine  Cutoff 1000 ng/mL MDMA (Ecstasy), urine              Cutoff 500 ng/mL Cocaine Metabolite, urine          Cutoff 300 ng/mL Opiate + metabolites, urine        Cutoff 300 ng/mL Phencyclidine (PCP), urine         Cutoff 25 ng/mL Cannabinoid, urine  Cutoff 50 ng/mL Barbiturates + metabolites, urine  Cutoff 200 ng/mL Benzodiazepine, urine              Cutoff 200 ng/mL Methadone, urine                   Cutoff 300 ng/mL  The urine drug screen provides only a preliminary, unconfirmed analytical test result and should not be used for non-medical purposes. Clinical consideration and professional judgment should be applied to any positive drug screen result due to possible interfering substances. A more specific alternate chemical method must be used in order to obtain a confirmed analytical result. Gas chromatography / mass spectrometry (GC/MS) is the preferred confirm                          atory method. Performed at [facility], [place], [place] [address]    Troponin I (High Sensitivity) 09/19/2022 6  <18 ng/L Final   Comment: (NOTE) Elevated high sensitivity troponin I (hsTnI) values and significant  changes across serial measurements may suggest ACS but many other  chronic and acute conditions are known to elevate hsTnI results.  Refer to the Links section for chest pain algorithms and additional  guidance. Performed at [facility], [address] [place], [place] [address]    SARS Coronavirus 2 by RT PCR 09/19/2022 NEGATIVE  NEGATIVE Final   Comment: (NOTE) SARS-CoV-2 target nucleic acids are NOT DETECTED.  The SARS-CoV-2 RNA is generally detectable in upper respiratory specimens during the acute phase of infection. The lowest concentration of SARS-CoV-2 viral copies this assay can detect is 138 copies/mL. A negative result does not preclude SARS-Cov-2 infection and should not be used  as the sole basis for treatment or other patient management decisions. A negative result may occur with  improper specimen collection/handling, submission of specimen other than nasopharyngeal swab, presence of viral mutation(s) within the areas targeted by this assay, and inadequate number of viral copies(<138 copies/mL). A negative result must be combined with clinical observations, patient history, and epidemiological information. The expected result is Negative.  Fact Sheet for Patients:  EntrepreneurPulse.com.au  Fact Sheet for Healthcare Providers:  IncredibleEmployment.be  This test is no                          t yet approved or cleared by the [place] FDA and  has been authorized for detection and/or diagnosis of SARS-CoV-2 by FDA under an Emergency Use Authorization (EUA). This EUA will remain  in effect (meaning this test can be used) for the duration of the COVID-19 declaration under Section 564(b)(1) of the Act, 21 U.S.C.section 360bbb-3(b)(1), unless the authorization is terminated  or revoked sooner.       Influenza A by PCR 09/19/2022 NEGATIVE  NEGATIVE Final   Influenza B by PCR 09/19/2022 NEGATIVE  NEGATIVE Final   Comment: (NOTE) The Xpert Xpress SARS-CoV-2/FLU/RSV plus assay is intended as an aid in the diagnosis of influenza from Nasopharyngeal swab specimens and should not be used as a sole basis for treatment. Nasal washings and aspirates are unacceptable for Xpert Xpress SARS-CoV-2/FLU/RSV testing.  Fact Sheet for Patients: EntrepreneurPulse.com.au  Fact Sheet for Healthcare Providers: IncredibleEmployment.be  This test is not yet approved or cleared by the [place] FDA and has been authorized for detection and/or diagnosis of SARS-CoV-2 by FDA under an Emergency Use Authorization (EUA). This EUA will remain  in effect (meaning this test can be used) for the duration of  the COVID-19 declaration under Section 564(b)(1) of the Act, 21 U.S.C. section 360bbb-3(b)(1), unless the authorization is terminated or revoked.     Resp Syncytial Virus by PCR 09/19/2022 NEGATIVE  NEGATIVE Final   Comment: (NOTE) Fact Sheet for Patients: EntrepreneurPulse.com.au  Fact Sheet for Healthcare Providers: IncredibleEmployment.be  This test is not yet approved or cleared by the [place] FDA and has been authorized for detection and/or diagnosis of SARS-CoV-2 by FDA under an Emergency Use Authorization (EUA). This EUA will remain in effect (meaning this test can be used) for the duration of the COVID-19 declaration under Section 564(b)(1) of the Act, 21 U.S.C. section 360bbb-3(b)(1), unless the authorization is terminated or revoked.  Performed at [facility], [address] [place], [place] [address]    Group A Strep by PCR 09/19/2022 NOT DETECTED  NOT DETECTED Final   Performed at [facility], [place], [place] [address]   Sodium 09/19/2022 143  135 - 145 mmol/L Final   Potassium 09/19/2022 3.9  3.5 - 5.1 mmol/L Final   Chloride 09/19/2022 106  98 - 111 mmol/L Final   CO2 09/19/2022 25  22 - 32 mmol/L Final   Glucose, Bld 09/19/2022 83  70 - 99 mg/dL Final   Glucose reference range applies only to samples taken after fasting for at least 8 hours.   BUN 09/19/2022 8  6 - 20 mg/dL Final   Creatinine, Ser 09/19/2022 0.92  0.61 - 1.24 mg/dL Final   Calcium 09/19/2022 7.9 (L)  8.9 - 10.3 mg/dL Final   Total Protein 09/19/2022 7.0  6.5 - 8.1 g/dL Final   Albumin 09/19/2022 3.8  3.5 - 5.0 g/dL Final   AST 09/19/2022 97 (H)  15 - 41 U/L Final   ALT 09/19/2022 48 (H)  0 - 44 U/L Final   Alkaline Phosphatase 09/19/2022 60  38 - 126 U/L Final   Total Bilirubin 09/19/2022 0.9  0.3 - 1.2 mg/dL Final   GFR, Estimated 09/19/2022 >60  >60 mL/min Final   Comment: (NOTE) Calculated using the CKD-EPI  Creatinine Equation (2021)    Anion gap 09/19/2022 12  5 - 15 Final   Performed at [facility], [address] [place], [place] [address]   Lipase 09/19/2022 51  11 - 51 U/L Final   Performed at [facility], [address] [place], [place] [address]   Troponin I (High Sensitivity) 09/19/2022 8  <18 ng/L Final   Comment: (NOTE) Elevated high sensitivity troponin I (hsTnI) values and significant  changes across serial measurements may suggest ACS but many other  chronic and acute conditions are known to elevate hsTnI results.  Refer to the "Links" section for chest pain algorithms and additional  guidance. Performed at [facility], [address] [place], [place] [address]   Admission on 09/15/2022, Discharged on 09/15/2022  Component Date Value Ref Range Status   WBC 09/15/2022 4.9  4.0 - 10.5 K/uL Final   RBC 09/15/2022 4.09 (L)  4.22 - 5.81 MIL/uL Final   Hemoglobin 09/15/2022 14.5  13.0 - 17.0 g/dL Final   HCT 09/15/2022 39.9  39.0 - 52.0 % Final   MCV 09/15/2022 97.6  80.0 - 100.0 fL Final   MCH 09/15/2022 35.5 (H)  26.0 - 34.0 pg Final   MCHC 09/15/2022 36.3 (H)  30.0 - 36.0 g/dL Final   RDW 09/15/2022 13.9  11.5 -  15.5 % Final   Platelets 09/15/2022 102 (L)  150 - 400 K/uL Final   nRBC 09/15/2022 0.0  0.0 - 0.2 % Final   Neutrophils Relative % 09/15/2022 37  % Final   Neutro Abs 09/15/2022 1.8  1.7 - 7.7 K/uL Final   Lymphocytes Relative 09/15/2022 49  % Final   Lymphs Abs 09/15/2022 2.4  0.7 - 4.0 K/uL Final   Monocytes Relative 09/15/2022 13  % Final   Monocytes Absolute 09/15/2022 0.6  0.1 - 1.0 K/uL Final   Eosinophils Relative 09/15/2022 0  % Final   Eosinophils Absolute 09/15/2022 0.0  0.0 - 0.5 K/uL Final   Basophils Relative 09/15/2022 1  % Final   Basophils Absolute 09/15/2022 0.1  0.0 - 0.1 K/uL Final   Immature Granulocytes 09/15/2022 0  % Final   Abs Immature Granulocytes 09/15/2022 0.00  0.00 - 0.07 K/uL Final   Performed  at [facility], [place], [place] [address]   Sodium 09/15/2022 136  135 - 145 mmol/L Final   Potassium 09/15/2022 3.7  3.5 - 5.1 mmol/L Final   Chloride 09/15/2022 99  98 - 111 mmol/L Final   CO2 09/15/2022 23  22 - 32 mmol/L Final   Glucose, Bld 09/15/2022 107 (H)  70 - 99 mg/dL Final   Glucose reference range applies only to samples taken after fasting for at least 8 hours.   BUN 09/15/2022 7  6 - 20 mg/dL Final   Creatinine, Ser 09/15/2022 0.83  0.61 - 1.24 mg/dL Final   Calcium 09/15/2022 8.5 (L)  8.9 - 10.3 mg/dL Final   Total Protein 09/15/2022 7.7  6.5 - 8.1 g/dL Final   Albumin 09/15/2022 4.1  3.5 - 5.0 g/dL Final   AST 09/15/2022 89 (H)  15 - 41 U/L Final   ALT 09/15/2022 54 (H)  0 - 44 U/L Final   Alkaline Phosphatase 09/15/2022 67  38 - 126 U/L Final   Total Bilirubin 09/15/2022 1.2  0.3 - 1.2 mg/dL Final   GFR, Estimated 09/15/2022 >60  >60 mL/min Final   Comment: (NOTE) Calculated using the CKD-EPI Creatinine Equation (2021)    Anion gap 09/15/2022 14  5 - 15 Final   Performed at [facility], [place], [place] [address]   Alcohol, Ethyl (B) 09/15/2022 313 (HH)  <10 mg/dL Final   Comment: CRITICAL RESULT CALLED TO, READ BACK BY AND VERIFIED WITH AMBER PAYNE AT 1722 ON 09/15/22 BY SS (NOTE) Lowest detectable limit for serum alcohol is 10 mg/dL.  For medical purposes only. Performed at [facility], [place], [place] [address]    Color, Urine 09/15/2022 YELLOW (A)  YELLOW Final   APPearance 09/15/2022 CLEAR (A)  CLEAR Final   Specific Gravity, Urine 09/15/2022 1.005  1.005 - 1.030 Final   pH 09/15/2022 6.0  5.0 - 8.0 Final   Glucose, UA 09/15/2022 NEGATIVE  NEGATIVE mg/dL Final   Hgb urine dipstick 09/15/2022 NEGATIVE  NEGATIVE Final   Bilirubin Urine 09/15/2022 NEGATIVE  NEGATIVE Final   Ketones, ur 09/15/2022 NEGATIVE  NEGATIVE mg/dL Final   Protein, ur 09/15/2022 NEGATIVE  NEGATIVE mg/dL  Final   Nitrite 09/15/2022 NEGATIVE  NEGATIVE Final   Leukocytes,Ua 09/15/2022 NEGATIVE  NEGATIVE Final   Performed at [facility], [address] [place], [place] [id number]   Tricyclic, Ur Screen 0000000 NONE DETECTED  NONE DETECTED Final   Amphetamines, Ur Screen 09/15/2022 NONE DETECTED  NONE  DETECTED Final   MDMA (Ecstasy)Ur Screen 09/15/2022 NONE DETECTED  NONE DETECTED Final   Cocaine Metabolite,Ur [place] 09/15/2022 NONE DETECTED  NONE DETECTED Final   Opiate, Ur Screen 09/15/2022 NONE DETECTED  NONE DETECTED Final   Phencyclidine (PCP) Ur S 09/15/2022 NONE DETECTED  NONE DETECTED Final   Cannabinoid 50 Ng, Ur [place] 09/15/2022 NONE DETECTED  NONE DETECTED Final   Barbiturates, Ur Screen 09/15/2022 NONE DETECTED  NONE DETECTED Final   Benzodiazepine, Ur Scrn 09/15/2022 NONE DETECTED  NONE DETECTED Final   Methadone Scn, Ur 09/15/2022 NONE DETECTED  NONE DETECTED Final   Comment: (NOTE) Tricyclics + metabolites, urine    Cutoff 1000 ng/mL Amphetamines + metabolites, urine  Cutoff 1000 ng/mL MDMA (Ecstasy), urine              Cutoff 500 ng/mL Cocaine Metabolite, urine          Cutoff 300 ng/mL Opiate + metabolites, urine        Cutoff 300 ng/mL Phencyclidine (PCP), urine         Cutoff 25 ng/mL Cannabinoid, urine                 Cutoff 50 ng/mL Barbiturates + metabolites, urine  Cutoff 200 ng/mL Benzodiazepine, urine              Cutoff 200 ng/mL Methadone, urine                   Cutoff 300 ng/mL  The urine drug screen provides only a preliminary, unconfirmed analytical test result and should not be used for non-medical purposes. Clinical consideration and professional judgment should be applied to any positive drug screen result due to possible interfering substances. A more specific alternate chemical method must be used in order to obtain a confirmed analytical result. Gas chromatography / mass spectrometry (GC/MS) is the preferred confirm                           atory method. Performed at [facility], [address] [place], [place] [address]     Blood Alcohol level:  Lab Results  Component Value Date   ETH 214 (H) 09/30/2022   ETH 313 (HH) 0000000    Metabolic Disorder Labs: Lab Results  Component Value Date   HGBA1C 5.4 09/30/2022   MPG 108 09/30/2022   No results found for: "PROLACTIN" Lab Results  Component Value Date   CHOL 224 (H) 09/30/2022   TRIG 67 09/30/2022   HDL >135 09/30/2022   CHOLHDL NOT CALCULATED 09/30/2022   VLDL 13 09/30/2022   LDLCALC NOT CALCULATED 09/30/2022    Therapeutic Lab Levels: No results found for: "LITHIUM" No results found for: "VALPROATE" No results found for: "CBMZ"  Physical Findings   PHQ2-9    Flowsheet Row ED from 09/30/2022 in [facility]  PHQ-2 Total Score 2  PHQ-9 Total Score 11      [place] ED from 09/30/2022 in [facility] ED from 09/19/2022 in [facility] Emergency Department at [facility] ED from 09/15/2022 in [facility] Emergency Department at [place] No Risk Low Risk No Risk        Musculoskeletal  Strength & Muscle Tone: within normal limits Gait & Station: normal Patient leans: N/A  Psychiatric Specialty Exam  Presentation General Appearance: Appropriate for Environment  Eye Contact:Fair  Speech:Clear and Coherent  Speech Volume:Normal  Handedness:-- (not assessed)   Mood and Affect  Mood:Euthymic  Affect:Congruent   Thought Process  Thought Processes:Coherent; Linear  Descriptions of Associations:Intact  Orientation:Full (Time, Place and Person)  Thought Content:Logical    Hallucinations:Hallucinations: None  Ideas of Reference:None  Suicidal Thoughts:Suicidal Thoughts: No  Homicidal Thoughts:Homicidal Thoughts: No   Sensorium  Memory:Immediate Fair; Recent Fair; Remote  Fair  Judgment:Fair  Insight:Fair   Executive Functions  Concentration:Fair  Attention Span:Fair  [place]   Psychomotor Activity  Psychomotor Activity:Psychomotor Activity: Normal   Assets  Assets:Communication Skills; Resilience   Sleep  Sleep:Sleep: Fair   Nutritional Assessment (For OBS and [facility]BC admissions only) Has the patient had a weight loss or gain of 10 pounds or more in the last 3 months?: No Has the patient had a decrease in food intake/or appetite?: Yes Does the patient have dental problems?: No Does the patient have eating habits or behaviors that may be indicators of an eating disorder including binging or inducing vomiting?: No Has the patient recently lost weight without trying?: 0 Has the patient been eating poorly because of a decreased appetite?: 0 Malnutrition Screening Tool Score: 0    Physical Exam Constitutional:      Appearance: the patient is not toxic-appearing.  Pulmonary:     Effort: Pulmonary effort is normal.  Neurological:     General: No focal deficit present.     Mental Status: the patient is alert and oriented to person, place, and time.   Review of Systems  Respiratory:  Negative for shortness of breath.   Cardiovascular:  Negative for chest pain.  Gastrointestinal:  Negative for abdominal pain, constipation, diarrhea, nausea and vomiting.  Neurological:  Negative for headaches.    BP 124/89   Pulse 99   Temp 99.2 F (37.3 C) (Oral)   Resp 18   SpO2 100%   Assessment and Plan:  Status: Voluntary, no acute safety concerns given lack of previous suicidality and patient contracting for safety  Alcohol use disorder, severe, active withdrawal, no history of severe withdrawal - Ativan taper with gabapentin, CIWA scoring with as needed Ativan - As needed trazodone for sleep - Patient will engage in detox and consider residential rehab  Medical: - Routine labs notable for  decreased platelets and increased T. bili consistent with impaired hepatic function - Thiamine - Hypertension and tachycardia, suspect secondary to withdrawal - INR and hepatitis panel ordered    Corky Sox, MD 10/03/2022 3:41 PM

## 2022-10-03 NOTE — Group Note (Signed)
Group Topic: Relapse and Recovery  Group Date: 10/03/2022 Start Time: 1000 End Time: [place] Facilitators: Hetty Blend, RN  Department: [facility]  Number of Participants: 5  Group Focus: clarity of thought, communication, family, and relapse prevention Treatment Modality:  Behavior Modification Therapy, Cognitive Behavioral Therapy, and Individual Therapy Interventions utilized were group exercise, mental fitness, patient education, and support Purpose: enhance coping skills, express feelings, improve communication skills, regain self-worth, reinforce self-care, relapse prevention strategies, and trigger / craving management  Name: Ryan Petty Date of Birth: 03/26/1966  MR: [id number]    Level of Participation: active Quality of Participation: attentive, cooperative, and offered feedback Interactions with others: gave feedback Mood/Affect: appropriate and positive Triggers (if applicable): n/a Cognition: coherent/clear Progress: Moderate Response: n/a Plan: patient will be encouraged to reach ou for more resources when needed  Patients Problems:  Patient Active Problem List   Diagnosis Date Noted   ETOH abuse 09/20/2022   Head injury 09/20/2022   MDD (major depressive disorder), recurrent episode, moderate ([place]) 09/19/2022   Alcohol abuse with intoxication ([place]) 09/19/2022   Supraglottitis 11/12/2021   Laryngitis, acute 11/12/2021   Nicotine dependence 11/12/2021   Hepatitis C without hepatic coma 01/13/2019

## 2022-10-03 NOTE — Progress Notes (Addendum)
Pt went to the courtyard for fresh air.

## 2022-10-03 NOTE — ED Notes (Signed)
Patient is sleeping. Respirations equal and unlabored, skin warm and dry, NAD. No change in assessment or acuity. Routine safety checks conducted according to facility protocol. Will continue to monitor for safety.   

## 2022-10-03 NOTE — ED Notes (Signed)
Pt sleeping@this time. Breathing even and unlabored. Will continue to monitor for saftey 

## 2022-10-03 NOTE — Progress Notes (Signed)
Pt had lunch and is currently watching TV. No distress noted or concerns voiced. Staff will monitor for pt's safety.

## 2022-10-04 DIAGNOSIS — R200 Anesthesia of skin: Secondary | ICD-10-CM | POA: Diagnosis not present

## 2022-10-04 DIAGNOSIS — R5383 Other fatigue: Secondary | ICD-10-CM | POA: Diagnosis not present

## 2022-10-04 DIAGNOSIS — F10229 Alcohol dependence with intoxication, unspecified: Secondary | ICD-10-CM | POA: Diagnosis not present

## 2022-10-04 DIAGNOSIS — Z1152 Encounter for screening for COVID-19: Secondary | ICD-10-CM | POA: Diagnosis not present

## 2022-10-04 NOTE — ED Provider Notes (Signed)
Behavioral Health Progress Note  Date and Time: 10/04/2022 1:43 PM Name: Ryan Petty MRN:  [id number]  Subjective:  Ryan Petty is a 35 yomale with little past psychiatric history, no documented behavioral health admissions. He presented to the behavioral urgent care requesting help with alcohol use. He was admitted to the facility based crisis.   On assessment today patient reports that his mood is fair and that he slept well with trazodone. Patient reports that his appetite is also good. He denies SI,HI, and AVH. Patient reports that he is not having any cravings for Etoh and that he is still looking forward to rehab. He reports that he has not had any withdrawal symptoms and reports that he believes the Ativan helped with this.   He does reports that he has night sweats and tremor at baseline. He reports that he also had significant weight loss but recalls he was not eating much. He reports that his tremors were worse when he was drinking. Again provider reiterated it will be important for patient to see PCP at dc.   Information obtained on initial evaluation: The patient is a 57 year old male with little past psychiatric history, no documented behavioral health admissions.  He presented to the behavioral urgent care requesting help with alcohol use.  He was admitted to the facility based crisis.   On my evaluation 3/5, the patient appears uncomfortable and is rocking back and forth.  As soon as I sit down he says "can I have something to go to sleep".  The patient says he is exhausted and that only alcohol helps him go to sleep.  He states that he thinks either about his girlfriend who died 1 year ago or "nothing".  He denies experiencing hallucinations.  His only physical complaint is numbness in his feet.   The patient reports that he was in prison for 7.5 years for felony gun possession.  He states that he was released in 2019 and since that time has been living with his mother, who is  now dying from cancer.  He states that his girlfriend died 1 year ago and that this was traumatic for him.  Since that time he has had trouble with alcohol use.  He reports that he drinks 2 or 3, 40 ounce beers per day.  His last drink was this morning.  He states that he feels shaky if he does not have a drink of alcohol for an extended period of time.  He denies ever experiencing a seizure or DTs.  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  He states that he occasionally uses crack cocaine or THC.  He states that he has a friend named Elaine/Tara who drove him here to get help.  He also states that he has a new girlfriend who uses crack frequently.   The patient reports experiencing depression and says that he has had suicidal thoughts with no plan.  He contracts for safety.  Diagnosis:  Final diagnoses:  Alcohol abuse with intoxication ([place])    Total Time spent with patient: 20 minutes  Past Psychiatric History: as above Past Medical History: as above Family History: none Family Psychiatric  History: none Social History: as above and per H and P   Additional Social History:  See H and P  Additional Social History:    Pain Medications: See MAR Prescriptions: See [facility]  Over the Counter: See MAR History of alcohol / drug use?: Yes Longest period of sobriety (when/how long): 2 years Negative Consequences of Use: Personal relationships, Financial Withdrawal Symptoms: Tremors Name of Substance 1: alcohol 1 - Age of First Use: 7 1 - Amount (size/oz): minimum 2-3 40 oz beers 1 - Frequency: daily 1 - Duration: ongoing 1 - Last Use / Amount: 09/30/22 today 1 - Method of Aquiring: purchase 1- Route of Use: oral Name of Substance 2: cannabis 2 - Age of First Use: 7-10 2 - Amount (size/oz): 1 joint shared 2 - Frequency: weekly 2 - Duration: ongoing 2 - Last Use / Amount: 2-3 weeks ago 2  - Method of Aquiring: unknown 2 - Route of Substance Use: smoke Name of Substance 3: crack cocaine 3 - Age of First Use: teens 3 - Amount (size/oz): unknown amount shared 3 - Frequency: "every few months" 3 - Duration: ongoing 3 - Last Use / Amount: 1 month ago 3 - Method of Aquiring: unknown 3 - Route of Substance Use: smoke              Sleep: Good  Appetite:  Good  Current Medications:  Current Facility-Administered Medications  Medication Dose Route Frequency Provider Last Rate Last Admin   acetaminophen (TYLENOL) tablet 650 mg  650 mg Oral Q6H PRN Tharon Aquas, NP       alum & mag hydroxide-simeth (MAALOX/MYLANTA) 200-200-20 MG/5ML suspension 30 mL  30 mL Oral Q4H PRN Tharon Aquas, NP       gabapentin (NEURONTIN) capsule 200 mg  200 mg Oral BID Corky Sox, MD   200 mg at 10/04/22 1022   magnesium hydroxide (MILK OF MAGNESIA) suspension 30 mL  30 mL Oral Daily PRN Tharon Aquas, NP       multivitamin with minerals tablet 1 tablet  1 tablet Oral Daily Corky Sox, MD   1 tablet at 10/04/22 1022   nicotine (NICODERM CQ - dosed in mg/24 hours) patch 21 mg  21 mg Transdermal Daily Tharon Aquas, NP   21 mg at 10/04/22 1021   thiamine (VITAMIN B1) tablet 100 mg  100 mg Oral Daily Corky Sox, MD   100 mg at 10/04/22 1022   traZODone (DESYREL) tablet 50 mg  50 mg Oral QHS PRN Corky Sox, MD   50 mg at 10/03/22 2142   No current outpatient medications on file.    Labs  Lab Results:  Admission on 09/30/2022  Component Date Value Ref Range Status   SARS Coronavirus 2 by RT PCR 09/30/2022 NEGATIVE  NEGATIVE Final   Influenza A by PCR 09/30/2022 NEGATIVE  NEGATIVE Final   Influenza B by PCR 09/30/2022 NEGATIVE  NEGATIVE Final   Comment: (NOTE) The Xpert Xpress SARS-CoV-2/FLU/RSV plus assay is intended as an aid in the diagnosis of influenza from Nasopharyngeal swab specimens and should not be used as a sole basis for treatment. Nasal  washings and aspirates are unacceptable for Xpert Xpress SARS-CoV-2/FLU/RSV testing.  Fact Sheet for Patients: EntrepreneurPulse.com.au  Fact Sheet for Healthcare Providers: IncredibleEmployment.be  This test is not yet approved or cleared by the [place] FDA and has been authorized for detection and/or diagnosis of SARS-CoV-2 by FDA under an Emergency Use Authorization (EUA). This EUA will remain in effect (meaning this test can be used) for the duration of the COVID-19 declaration under Section 564(b)(1) of the Act, 21 U.S.C. section 360bbb-3(b)(1), unless the authorization is terminated or revoked.  Resp Syncytial Virus by PCR 09/30/2022 NEGATIVE  NEGATIVE Final   Comment: (NOTE) Fact Sheet for Patients: EntrepreneurPulse.com.au  Fact Sheet for Healthcare Providers: IncredibleEmployment.be  This test is not yet approved or cleared by the [place] FDA and has been authorized for detection and/or diagnosis of SARS-CoV-2 by FDA under an Emergency Use Authorization (EUA). This EUA will remain in effect (meaning this test can be used) for the duration of the COVID-19 declaration under Section 564(b)(1) of the Act, 21 U.S.C. section 360bbb-3(b)(1), unless the authorization is terminated or revoked.  Performed at [facility], [place] [address], [place], [place] [address]    WBC 09/30/2022 4.4  4.0 - 10.5 K/uL Final   RBC 09/30/2022 4.11 (L)  4.22 - 5.81 MIL/uL Final   Hemoglobin 09/30/2022 14.7  13.0 - 17.0 g/dL Final   HCT 09/30/2022 39.9  39.0 - 52.0 % Final   MCV 09/30/2022 97.1  80.0 - 100.0 fL Final   MCH 09/30/2022 35.8 (H)  26.0 - 34.0 pg Final   MCHC 09/30/2022 36.8 (H)  30.0 - 36.0 g/dL Final   RDW 09/30/2022 15.7 (H)  11.5 - 15.5 % Final   Platelets 09/30/2022 126 (L)  150 - 400 K/uL Final   nRBC 09/30/2022 0.0  0.0 - 0.2 % Final   Neutrophils Relative % 09/30/2022 51  % Final    Neutro Abs 09/30/2022 2.2  1.7 - 7.7 K/uL Final   Lymphocytes Relative 09/30/2022 36  % Final   Lymphs Abs 09/30/2022 1.6  0.7 - 4.0 K/uL Final   Monocytes Relative 09/30/2022 11  % Final   Monocytes Absolute 09/30/2022 0.5  0.1 - 1.0 K/uL Final   Eosinophils Relative 09/30/2022 1  % Final   Eosinophils Absolute 09/30/2022 0.0  0.0 - 0.5 K/uL Final   Basophils Relative 09/30/2022 1  % Final   Basophils Absolute 09/30/2022 0.1  0.0 - 0.1 K/uL Final   Immature Granulocytes 09/30/2022 0  % Final   Abs Immature Granulocytes 09/30/2022 0.00  0.00 - 0.07 K/uL Final   Performed at [facility], [place] [address] [place], [place] [address]   Sodium 09/30/2022 140  135 - 145 mmol/L Final   Potassium 09/30/2022 4.1  3.5 - 5.1 mmol/L Final   Chloride 09/30/2022 100  98 - 111 mmol/L Final   CO2 09/30/2022 24  22 - 32 mmol/L Final   Glucose, Bld 09/30/2022 71  70 - 99 mg/dL Final   Glucose reference range applies only to samples taken after fasting for at least 8 hours.   BUN 09/30/2022 8  6 - 20 mg/dL Final   Creatinine, Ser 09/30/2022 1.01  0.61 - 1.24 mg/dL Final   Calcium 09/30/2022 8.5 (L)  8.9 - 10.3 mg/dL Final   Total Protein 09/30/2022 7.5  6.5 - 8.1 g/dL Final   Albumin 09/30/2022 4.2  3.5 - 5.0 g/dL Final   AST 09/30/2022 155 (H)  15 - 41 U/L Final   ALT 09/30/2022 78 (H)  0 - 44 U/L Final   Alkaline Phosphatase 09/30/2022 73  38 - 126 U/L Final   Total Bilirubin 09/30/2022 1.5 (H)  0.3 - 1.2 mg/dL Final   GFR, Estimated 09/30/2022 >60  >60 mL/min Final   Comment: (NOTE) Calculated using the CKD-EPI Creatinine Equation (2021)    Anion gap 09/30/2022 16 (H)  5 - 15 Final   Performed at [facility], [place] [address], [place], [place] [address]   Hgb  A1c MFr Bld 09/30/2022 5.4  4.8 - 5.6 % Final   Comment: (NOTE)         Prediabetes: 5.7 - 6.4         Diabetes: >6.4         Glycemic control for adults with diabetes: <7.0    Mean Plasma Glucose 09/30/2022 108  mg/dL Final    Comment: (NOTE) Performed At: [facility] [place], [place] [id number] Rush Farmer MD [id number]    Alcohol, Ethyl (B) 09/30/2022 214 (H)  <10 mg/dL Final   Comment: (NOTE) Lowest detectable limit for serum alcohol is 10 mg/dL.  For medical purposes only. Performed at [facility], [place] [address], [place], [place] [address]    TSH 09/30/2022 0.590  0.350 - 4.500 uIU/mL Final   Comment: Performed by a 3rd Generation assay with a functional sensitivity of <=0.01 uIU/mL. Performed at [facility], [place] [address], [place], [place] [address]    POC Amphetamine UR 09/30/2022 None Detected  NONE DETECTED (Cut Off Level 1000 ng/mL) Final   POC Secobarbital (BAR) 09/30/2022 None Detected  NONE DETECTED (Cut Off Level 300 ng/mL) Final   POC Buprenorphine (BUP) 09/30/2022 None Detected  NONE DETECTED (Cut Off Level 10 ng/mL) Final   POC Oxazepam (BZO) 09/30/2022 None Detected  NONE DETECTED (Cut Off Level 300 ng/mL) Final   POC Cocaine UR 09/30/2022 None Detected  NONE DETECTED (Cut Off Level 300 ng/mL) Final   POC Methamphetamine UR 09/30/2022 None Detected  NONE DETECTED (Cut Off Level 1000 ng/mL) Final   POC Morphine 09/30/2022 None Detected  NONE DETECTED (Cut Off Level 300 ng/mL) Final   POC Methadone UR 09/30/2022 None Detected  NONE DETECTED (Cut Off Level 300 ng/mL) Final   POC Oxycodone UR 09/30/2022 None Detected  NONE DETECTED (Cut Off Level 100 ng/mL) Final   POC Marijuana UR 09/30/2022 Positive (A)  NONE DETECTED (Cut Off Level 50 ng/mL) Final   SARSCOV2ONAVIRUS 2 AG 09/30/2022 NEGATIVE  NEGATIVE Final   Comment: (NOTE) SARS-CoV-2 antigen NOT DETECTED.   Negative results are presumptive.  Negative results do not preclude SARS-CoV-2 infection and should not be used as the sole basis for treatment or other patient management decisions, including infection  control decisions, particularly in the presence of clinical signs and  symptoms  consistent with COVID-19, or in those who have been in contact with the virus.  Negative results must be combined with clinical observations, patient history, and epidemiological information. The expected result is Negative.  Fact Sheet for Patients: HandmadeRecipes.com.cy  Fact Sheet for Healthcare Providers: FuneralLife.at  This test is not yet approved or cleared by the [place] FDA and  has been authorized for detection and/or diagnosis of SARS-CoV-2 by FDA under an Emergency Use Authorization (EUA).  This EUA will remain in effect (meaning this test can be used) for the duration of  the COV                          ID-19 declaration under Section 564(b)(1) of the Act, 21 U.S.C. section 360bbb-3(b)(1), unless the authorization is terminated or revoked sooner.     Cholesterol 09/30/2022 224 (H)  0 - 200 mg/dL Final   Triglycerides 09/30/2022 67  <150 mg/dL Final   HDL 09/30/2022 >135  >40 mg/dL Final   Total CHOL/HDL Ratio 09/30/2022 NOT CALCULATED  RATIO Final   VLDL 09/30/2022 13  0 - 40 mg/dL Final  LDL Cholesterol 09/30/2022 NOT CALCULATED  0 - 99 mg/dL Final   Performed at [place] [address], [place], [place] [address]   Hepatitis B Surface Ag 10/03/2022 NON REACTIVE  NON REACTIVE Final   HCV Ab 10/03/2022 Reactive (A)  NON REACTIVE Final   Comment: (NOTE) The CDC recommends that a Reactive HCV antibody result be followed up  with a HCV Nucleic Acid Amplification test.     Hep A IgM 10/03/2022 NON REACTIVE  NON REACTIVE Final   Hep B C IgM 10/03/2022 NON REACTIVE  NON REACTIVE Final   Performed at [facility], [place] [address], [place], [place] [address]   Prothrombin Time 10/03/2022 11.7  11.4 - 15.2 seconds Final   INR 10/03/2022 0.9  0.8 - 1.2 Final   Comment: (NOTE) INR goal varies based on device and disease states. Performed at [facility], [place] [address], [place],  [place] [address]   Admission on 09/19/2022, Discharged on 09/20/2022  Component Date Value Ref Range Status   WBC 09/19/2022 6.3  4.0 - 10.5 K/uL Final   RBC 09/19/2022 4.15 (L)  4.22 - 5.81 MIL/uL Final   Hemoglobin 09/19/2022 14.6  13.0 - 17.0 g/dL Final   HCT 09/19/2022 41.7  39.0 - 52.0 % Final   MCV 09/19/2022 100.5 (H)  80.0 - 100.0 fL Final   MCH 09/19/2022 35.2 (H)  26.0 - 34.0 pg Final   MCHC 09/19/2022 35.0  30.0 - 36.0 g/dL Final   RDW 09/19/2022 14.9  11.5 - 15.5 % Final   Platelets 09/19/2022 96 (L)  150 - 400 K/uL Final   nRBC 09/19/2022 0.0  0.0 - 0.2 % Final   Neutrophils Relative % 09/19/2022 55  % Final   Neutro Abs 09/19/2022 3.5  1.7 - 7.7 K/uL Final   Lymphocytes Relative 09/19/2022 31  % Final   Lymphs Abs 09/19/2022 1.9  0.7 - 4.0 K/uL Final   Monocytes Relative 09/19/2022 11  % Final   Monocytes Absolute 09/19/2022 0.7  0.1 - 1.0 K/uL Final   Eosinophils Relative 09/19/2022 1  % Final   Eosinophils Absolute 09/19/2022 0.1  0.0 - 0.5 K/uL Final   Basophils Relative 09/19/2022 1  % Final   Basophils Absolute 09/19/2022 0.1  0.0 - 0.1 K/uL Final   Immature Granulocytes 09/19/2022 1  % Final   Abs Immature Granulocytes 09/19/2022 0.03  0.00 - 0.07 K/uL Final   Performed at [facility], [place], [place] [address]   Color, Urine 09/19/2022 STRAW (A)  YELLOW Final   APPearance 09/19/2022 CLEAR (A)  CLEAR Final   Specific Gravity, Urine 09/19/2022 1.004 (L)  1.005 - 1.030 Final   pH 09/19/2022 5.0  5.0 - 8.0 Final   Glucose, UA 09/19/2022 NEGATIVE  NEGATIVE mg/dL Final   Hgb urine dipstick 09/19/2022 NEGATIVE  NEGATIVE Final   Bilirubin Urine 09/19/2022 NEGATIVE  NEGATIVE Final   Ketones, ur 09/19/2022 NEGATIVE  NEGATIVE mg/dL Final   Protein, ur 09/19/2022 NEGATIVE  NEGATIVE mg/dL Final   Nitrite 09/19/2022 NEGATIVE  NEGATIVE Final   Leukocytes,Ua 09/19/2022 NEGATIVE  NEGATIVE Final   Performed at [facility], [address]  [place], [place] [id number]   Tricyclic, Ur Screen [id number] NONE DETECTED  NONE DETECTED Final   Amphetamines, Ur Screen 09/19/2022 NONE DETECTED  NONE DETECTED Final   MDMA (Ecstasy)Ur Screen 09/19/2022 NONE DETECTED  NONE DETECTED Final   Cocaine Metabolite,Ur [place] 09/19/2022  NONE DETECTED  NONE DETECTED Final   Opiate, Ur Screen 09/19/2022 NONE DETECTED  NONE DETECTED Final   Phencyclidine (PCP) Ur S 09/19/2022 NONE DETECTED  NONE DETECTED Final   Cannabinoid 50 Ng, Ur [place] 09/19/2022 NONE DETECTED  NONE DETECTED Final   Barbiturates, Ur Screen 09/19/2022 NONE DETECTED  NONE DETECTED Final   Benzodiazepine, Ur Scrn 09/19/2022 NONE DETECTED  NONE DETECTED Final   Methadone Scn, Ur 09/19/2022 NONE DETECTED  NONE DETECTED Final   Comment: (NOTE) Tricyclics + metabolites, urine    Cutoff 1000 ng/mL Amphetamines + metabolites, urine  Cutoff 1000 ng/mL MDMA (Ecstasy), urine              Cutoff 500 ng/mL Cocaine Metabolite, urine          Cutoff 300 ng/mL Opiate + metabolites, urine        Cutoff 300 ng/mL Phencyclidine (PCP), urine         Cutoff 25 ng/mL Cannabinoid, urine                 Cutoff 50 ng/mL Barbiturates + metabolites, urine  Cutoff 200 ng/mL Benzodiazepine, urine              Cutoff 200 ng/mL Methadone, urine                   Cutoff 300 ng/mL  The urine drug screen provides only a preliminary, unconfirmed analytical test result and should not be used for non-medical purposes. Clinical consideration and professional judgment should be applied to any positive drug screen result due to possible interfering substances. A more specific alternate chemical method must be used in order to obtain a confirmed analytical result. Gas chromatography / mass spectrometry (GC/MS) is the preferred confirm                          atory method. Performed at [facility], [place], [place] [address]    Troponin I (High Sensitivity) 09/19/2022 6  <18 ng/L Final   Comment:  (NOTE) Elevated high sensitivity troponin I (hsTnI) values and significant  changes across serial measurements may suggest ACS but many other  chronic and acute conditions are known to elevate hsTnI results.  Refer to the Links section for chest pain algorithms and additional  guidance. Performed at [facility], [address] [place], [place] [address]    SARS Coronavirus 2 by RT PCR 09/19/2022 NEGATIVE  NEGATIVE Final   Comment: (NOTE) SARS-CoV-2 target nucleic acids are NOT DETECTED.  The SARS-CoV-2 RNA is generally detectable in upper respiratory specimens during the acute phase of infection. The lowest concentration of SARS-CoV-2 viral copies this assay can detect is 138 copies/mL. A negative result does not preclude SARS-Cov-2 infection and should not be used as the sole basis for treatment or other patient management decisions. A negative result may occur with  improper specimen collection/handling, submission of specimen other than nasopharyngeal swab, presence of viral mutation(s) within the areas targeted by this assay, and inadequate number of viral copies(<138 copies/mL). A negative result must be combined with clinical observations, patient history, and epidemiological information. The expected result is Negative.  Fact Sheet for Patients:  EntrepreneurPulse.com.au  Fact Sheet for Healthcare Providers:  IncredibleEmployment.be  This test is no                          t yet approved or cleared  by the [place] and  has been authorized for detection and/or diagnosis of SARS-CoV-2 by FDA under an Emergency Use Authorization (EUA). This EUA will remain  in effect (meaning this test can be used) for the duration of the COVID-19 declaration under Section 564(b)(1) of the Act, 21 U.S.C.section 360bbb-3(b)(1), unless the authorization is terminated  or revoked sooner.       Influenza A by PCR 09/19/2022 NEGATIVE   NEGATIVE Final   Influenza B by PCR 09/19/2022 NEGATIVE  NEGATIVE Final   Comment: (NOTE) The Xpert Xpress SARS-CoV-2/FLU/RSV plus assay is intended as an aid in the diagnosis of influenza from Nasopharyngeal swab specimens and should not be used as a sole basis for treatment. Nasal washings and aspirates are unacceptable for Xpert Xpress SARS-CoV-2/FLU/RSV testing.  Fact Sheet for Patients: EntrepreneurPulse.com.au  Fact Sheet for Healthcare Providers: IncredibleEmployment.be  This test is not yet approved or cleared by the [place] FDA and has been authorized for detection and/or diagnosis of SARS-CoV-2 by FDA under an Emergency Use Authorization (EUA). This EUA will remain in effect (meaning this test can be used) for the duration of the COVID-19 declaration under Section 564(b)(1) of the Act, 21 U.S.C. section 360bbb-3(b)(1), unless the authorization is terminated or revoked.     Resp Syncytial Virus by PCR 09/19/2022 NEGATIVE  NEGATIVE Final   Comment: (NOTE) Fact Sheet for Patients: EntrepreneurPulse.com.au  Fact Sheet for Healthcare Providers: IncredibleEmployment.be  This test is not yet approved or cleared by the [place] FDA and has been authorized for detection and/or diagnosis of SARS-CoV-2 by FDA under an Emergency Use Authorization (EUA). This EUA will remain in effect (meaning this test can be used) for the duration of the COVID-19 declaration under Section 564(b)(1) of the Act, 21 U.S.C. section 360bbb-3(b)(1), unless the authorization is terminated or revoked.  Performed at [facility], [address] [place], [place] [address]    Group A Strep by PCR 09/19/2022 NOT DETECTED  NOT DETECTED Final   Performed at [facility], [place], [place] [address]   Sodium 09/19/2022 143  135 - 145 mmol/L Final   Potassium 09/19/2022 3.9  3.5 - 5.1  mmol/L Final   Chloride 09/19/2022 106  98 - 111 mmol/L Final   CO2 09/19/2022 25  22 - 32 mmol/L Final   Glucose, Bld 09/19/2022 83  70 - 99 mg/dL Final   Glucose reference range applies only to samples taken after fasting for at least 8 hours.   BUN 09/19/2022 8  6 - 20 mg/dL Final   Creatinine, Ser 09/19/2022 0.92  0.61 - 1.24 mg/dL Final   Calcium 09/19/2022 7.9 (L)  8.9 - 10.3 mg/dL Final   Total Protein 09/19/2022 7.0  6.5 - 8.1 g/dL Final   Albumin 09/19/2022 3.8  3.5 - 5.0 g/dL Final   AST 09/19/2022 97 (H)  15 - 41 U/L Final   ALT 09/19/2022 48 (H)  0 - 44 U/L Final   Alkaline Phosphatase 09/19/2022 60  38 - 126 U/L Final   Total Bilirubin 09/19/2022 0.9  0.3 - 1.2 mg/dL Final   GFR, Estimated 09/19/2022 >60  >60 mL/min Final   Comment: (NOTE) Calculated using the CKD-EPI Creatinine Equation (2021)    Anion gap 09/19/2022 12  5 - 15 Final   Performed at [facility], [place] [place], [place] [address]   Lipase 09/19/2022 51  11 - 51 U/L Final   Performed at [facility], [place], [place] [address]   Troponin I (High Sensitivity) 09/19/2022 8  <18 ng/L Final   Comment: (NOTE) Elevated high sensitivity troponin I (hsTnI) values and significant  changes across serial measurements may suggest ACS but many other  chronic and acute conditions are known to elevate hsTnI results.  Refer to the "Links" section for chest pain algorithms and additional  guidance. Performed at [facility], [address] [place], [place] [address]   Admission on 09/15/2022, Discharged on 09/15/2022  Component Date Value Ref Range Status   WBC 09/15/2022 4.9  4.0 - 10.5 K/uL Final   RBC 09/15/2022 4.09 (L)  4.22 - 5.81 MIL/uL Final   Hemoglobin 09/15/2022 14.5  13.0 - 17.0 g/dL Final   HCT 09/15/2022 39.9  39.0 - 52.0 % Final   MCV 09/15/2022 97.6  80.0 - 100.0 fL Final   MCH 09/15/2022 35.5 (H)  26.0 - 34.0 pg Final   MCHC 09/15/2022 36.3  (H)  30.0 - 36.0 g/dL Final   RDW 09/15/2022 13.9  11.5 - 15.5 % Final   Platelets 09/15/2022 102 (L)  150 - 400 K/uL Final   nRBC 09/15/2022 0.0  0.0 - 0.2 % Final   Neutrophils Relative % 09/15/2022 37  % Final   Neutro Abs 09/15/2022 1.8  1.7 - 7.7 K/uL Final   Lymphocytes Relative 09/15/2022 49  % Final   Lymphs Abs 09/15/2022 2.4  0.7 - 4.0 K/uL Final   Monocytes Relative 09/15/2022 13  % Final   Monocytes Absolute 09/15/2022 0.6  0.1 - 1.0 K/uL Final   Eosinophils Relative 09/15/2022 0  % Final   Eosinophils Absolute 09/15/2022 0.0  0.0 - 0.5 K/uL Final   Basophils Relative 09/15/2022 1  % Final   Basophils Absolute 09/15/2022 0.1  0.0 - 0.1 K/uL Final   Immature Granulocytes 09/15/2022 0  % Final   Abs Immature Granulocytes 09/15/2022 0.00  0.00 - 0.07 K/uL Final   Performed at [facility], [place], [place] [address]   Sodium 09/15/2022 136  135 - 145 mmol/L Final   Potassium 09/15/2022 3.7  3.5 - 5.1 mmol/L Final   Chloride 09/15/2022 99  98 - 111 mmol/L Final   CO2 09/15/2022 23  22 - 32 mmol/L Final   Glucose, Bld 09/15/2022 107 (H)  70 - 99 mg/dL Final   Glucose reference range applies only to samples taken after fasting for at least 8 hours.   BUN 09/15/2022 7  6 - 20 mg/dL Final   Creatinine, Ser 09/15/2022 0.83  0.61 - 1.24 mg/dL Final   Calcium 09/15/2022 8.5 (L)  8.9 - 10.3 mg/dL Final   Total Protein 09/15/2022 7.7  6.5 - 8.1 g/dL Final   Albumin 09/15/2022 4.1  3.5 - 5.0 g/dL Final   AST 09/15/2022 89 (H)  15 - 41 U/L Final   ALT 09/15/2022 54 (H)  0 - 44 U/L Final   Alkaline Phosphatase 09/15/2022 67  38 - 126 U/L Final   Total Bilirubin 09/15/2022 1.2  0.3 - 1.2 mg/dL Final   GFR, Estimated 09/15/2022 >60  >60 mL/min Final   Comment: (NOTE) Calculated using the CKD-EPI Creatinine Equation (2021)    Anion gap 09/15/2022 14  5 - 15 Final   Performed at [facility], [place],  [address]   Alcohol, Ethyl  (B) 09/15/2022 313 (HH)  <10 mg/dL Final   Comment: CRITICAL RESULT CALLED TO,  READ BACK BY AND VERIFIED WITH AMBER PAYNE AT [id number] ON 09/15/22 BY SS (NOTE) Lowest detectable limit for serum alcohol is 10 mg/dL.  For medical purposes only. Performed at [facility], [place], [place] [address]    Color, Urine 09/15/2022 YELLOW (A)  YELLOW Final   APPearance 09/15/2022 CLEAR (A)  CLEAR Final   Specific Gravity, Urine 09/15/2022 1.005  1.005 - 1.030 Final   pH 09/15/2022 6.0  5.0 - 8.0 Final   Glucose, UA 09/15/2022 NEGATIVE  NEGATIVE mg/dL Final   Hgb urine dipstick 09/15/2022 NEGATIVE  NEGATIVE Final   Bilirubin Urine 09/15/2022 NEGATIVE  NEGATIVE Final   Ketones, ur 09/15/2022 NEGATIVE  NEGATIVE mg/dL Final   Protein, ur 09/15/2022 NEGATIVE  NEGATIVE mg/dL Final   Nitrite 09/15/2022 NEGATIVE  NEGATIVE Final   Leukocytes,Ua 09/15/2022 NEGATIVE  NEGATIVE Final   Performed at [facility], [address] [place], [place] [id number]   Tricyclic, Ur Screen 0000000 NONE DETECTED  NONE DETECTED Final   Amphetamines, Ur Screen 09/15/2022 NONE DETECTED  NONE DETECTED Final   MDMA (Ecstasy)Ur Screen 09/15/2022 NONE DETECTED  NONE DETECTED Final   Cocaine Metabolite,Ur [place] 09/15/2022 NONE DETECTED  NONE DETECTED Final   Opiate, Ur Screen 09/15/2022 NONE DETECTED  NONE DETECTED Final   Phencyclidine (PCP) Ur S 09/15/2022 NONE DETECTED  NONE DETECTED Final   Cannabinoid 50 Ng, Ur [place] 09/15/2022 NONE DETECTED  NONE DETECTED Final   Barbiturates, Ur Screen 09/15/2022 NONE DETECTED  NONE DETECTED Final   Benzodiazepine, Ur Scrn 09/15/2022 NONE DETECTED  NONE DETECTED Final   Methadone Scn, Ur 09/15/2022 NONE DETECTED  NONE DETECTED Final   Comment: (NOTE) Tricyclics + metabolites, urine    Cutoff 1000 ng/mL Amphetamines + metabolites, urine  Cutoff 1000 ng/mL MDMA (Ecstasy), urine              Cutoff 500 ng/mL Cocaine Metabolite, urine          Cutoff 300  ng/mL Opiate + metabolites, urine        Cutoff 300 ng/mL Phencyclidine (PCP), urine         Cutoff 25 ng/mL Cannabinoid, urine                 Cutoff 50 ng/mL Barbiturates + metabolites, urine  Cutoff 200 ng/mL Benzodiazepine, urine              Cutoff 200 ng/mL Methadone, urine                   Cutoff 300 ng/mL  The urine drug screen provides only a preliminary, unconfirmed analytical test result and should not be used for non-medical purposes. Clinical consideration and professional judgment should be applied to any positive drug screen result due to possible interfering substances. A more specific alternate chemical method must be used in order to obtain a confirmed analytical result. Gas chromatography / mass spectrometry (GC/MS) is the preferred confirm                          atory method. Performed at [facility], [address], [place], [place] [address]     Blood Alcohol level:  Lab Results  Component Value Date   ETH 214 (H) 09/30/2022   ETH 313 (HH) 0000000    Metabolic Disorder Labs: Lab Results  Component Value Date   HGBA1C 5.4 09/30/2022   MPG 108 09/30/2022   No results found for: "  PROLACTIN" Lab Results  Component Value Date   CHOL 224 (H) 09/30/2022   TRIG 67 09/30/2022   HDL >135 09/30/2022   CHOLHDL NOT CALCULATED 09/30/2022   VLDL 13 09/30/2022   LDLCALC NOT CALCULATED 09/30/2022    Therapeutic Lab Levels: No results found for: "LITHIUM" No results found for: "VALPROATE" No results found for: "CBMZ"  Physical Findings   PHQ2-9    Flowsheet Row ED from 09/30/2022 in [facility]  PHQ-2 Total Score 2  PHQ-9 Total Score 16      [place] ED from 09/30/2022 in [facility] ED from 09/19/2022 in [facility] Emergency Department at [facility] ED from 09/15/2022 in [facility] Emergency Department at [place] No Risk Low Risk No Risk         Musculoskeletal  Strength & Muscle Tone: within normal limits Gait & Station: normal Patient leans: N/A  Psychiatric Specialty Exam  Presentation  General Appearance:  Appropriate for Environment; Casual  Eye Contact: Good  Speech: Clear and Coherent  Speech Volume: Normal  Handedness: Right   Mood and Affect  Mood: Euthymic  Affect: Appropriate; Congruent   Thought Process  Thought Processes: Coherent  Descriptions of Associations:Intact  Orientation:Full (Time, Place and Person)  Thought Content:Logical  Diagnosis of Schizophrenia or Schizoaffective disorder in past: No    Hallucinations:Hallucinations: None  Ideas of Reference:None  Suicidal Thoughts:Suicidal Thoughts: No  Homicidal Thoughts:Homicidal Thoughts: No   Sensorium  Memory: Immediate Good  Judgment: Good  Insight: Good   Executive Functions  Concentration: Good  Attention Span: Good  Recall: [place] of Knowledge: Good  Language: Good   Psychomotor Activity  Psychomotor Activity: Psychomotor Activity: Normal   Assets  Assets: Communication Skills; Resilience; Housing; Social Support; Desire for Improvement   Sleep  Sleep: Sleep: Good   No data recorded  Physical Exam  Physical Exam HENT:     Head: Normocephalic and atraumatic.  Pulmonary:     Effort: Pulmonary effort is normal.  Neurological:     Mental Status: He is alert and oriented to person, place, and time.    Review of Systems  Psychiatric/Behavioral:  Negative for depression and hallucinations. The patient is not nervous/anxious and does not have insomnia.    Blood pressure 118/81, pulse 99, temperature 98.3 F (36.8 C), temperature source Oral, resp. rate 18, SpO2 100 %. There is no height or weight on file to calculate BMI.  Treatment Plan Summary: Daily contact with patient to assess and evaluate symptoms and progress in treatment and Medication management  Based  on assessment today patient appears to be doing well, with continued interest in rehab. He has completed his Ativan taper without complications. His reports of weight loss, night sweats, tremor at baseline are concerning, but should be evaluated after dc by a PCP. Patient was already aware of his Hep C status.   Alcohol use disorder, severe, active withdrawal, no history of severe withdrawal -- Ativan taper completed, 10/03/22 - Continue gabapentin '300mg'$  BID - As needed trazodone '50mg'$  for sleep - Patient will engage in detox and consider residential rehab   Medical: - Routine labs notable for decreased platelets and increased T. bili consistent with impaired hepatic function - Thiamine - Hypertension and tachycardia, suspect secondary to withdrawal - INR (WNL) and hepatitis panel ( Hep C positive)    PGY-3 Freida Busman, MD 10/04/2022 1:43 PM

## 2022-10-04 NOTE — ED Notes (Signed)
Patient is sitting in the dining room watching television, no distress noted will continue to monitor patient for safety.

## 2022-10-04 NOTE — Group Note (Signed)
Group Topic: Recovery Basics  Group Date: 10/04/2022 Start Time: 0900 End Time: 1000 Facilitators: Parks Neptune, NT  Department: [facility]  Number of Participants: 8  Group Focus: daily focus and substance abuse education Treatment Modality:  Psychoeducation Interventions utilized were clarification, group exercise, and support Purpose: enhance coping skills and increase insight  Name: Ryan Petty Date of Birth: 1965-09-26  MR: [id number]    Level of Participation: moderate Quality of Participation: attentive Interactions with others: positive Mood/Affect: appropriate Triggers (if applicable): n/a Cognition: coherent/clear Progress: Gaining insight Response: positive Plan: follow-up needed  Patients Problems:  Patient Active Problem List   Diagnosis Date Noted   ETOH abuse 09/20/2022   Head injury 09/20/2022   MDD (major depressive disorder), recurrent episode, moderate ([place]) 09/19/2022   Alcohol abuse with intoxication ([place]) 09/19/2022   Supraglottitis 11/12/2021   Laryngitis, acute 11/12/2021   Nicotine dependence 11/12/2021   Hepatitis C without hepatic coma 01/13/2019

## 2022-10-04 NOTE — ED Notes (Signed)
Pt in dayroom with peers. Denies SI/HI/AVH. No c/o withdrawal symptoms but states that at times he is a bit clammy. No noted distress. Will continue to monitor for safety

## 2022-10-04 NOTE — ED Notes (Signed)
Patient is sleeping. Respirations equal and unlabored, skin warm and dry, NAD. No change in assessment or acuity. Routine safety checks conducted according to facility protocol. Will continue to monitor for safety.   

## 2022-10-04 NOTE — ED Notes (Signed)
Patient awake alert and calm on unit.  Patient without distress or complaint.  He makes needs known, attended group this morning and is social with select peers.  No evidence of withdrawal.  Patient is motivated for treatment.  Will monitor

## 2022-10-04 NOTE — ED Notes (Signed)
Pt left from dayroom abruptly and was crying. This nurse checked on him in his room. He states that he had been talking about his girlfriend that died last year with peers and became emotional. Talked with pt about grief and provided supportive listening.  After 25 minutes pt return to dayroom with peers to watch tv. No noted distress. Will continue to monitor for safety

## 2022-10-05 DIAGNOSIS — F10229 Alcohol dependence with intoxication, unspecified: Secondary | ICD-10-CM | POA: Diagnosis not present

## 2022-10-05 DIAGNOSIS — Z1152 Encounter for screening for COVID-19: Secondary | ICD-10-CM | POA: Diagnosis not present

## 2022-10-05 DIAGNOSIS — R5383 Other fatigue: Secondary | ICD-10-CM | POA: Diagnosis not present

## 2022-10-05 DIAGNOSIS — R200 Anesthesia of skin: Secondary | ICD-10-CM | POA: Diagnosis not present

## 2022-10-05 MED ORDER — HYDROXYZINE HCL 25 MG PO TABS
25.0000 mg | ORAL_TABLET | Freq: Three times a day (TID) | ORAL | Status: DC | PRN
  Administered 2022-10-05 – 2022-10-06 (×4): 25 mg via ORAL
  Filled 2022-10-05 (×4): qty 1

## 2022-10-05 NOTE — Group Note (Signed)
Group Topic: Overcoming Obstacles  Group Date: 10/05/2022 Start Time: 0800 End Time: 0900 Facilitators: Mervyn Skeeters D, NT  Department: [facility]  Number of Participants: 6  Group Focus: relaxation Treatment Modality:  Psychoeducation Interventions utilized were assignment Purpose: increase insight  Name: Ryan Petty Date of Birth: 18-Feb-1966  MR: [id number]    Level of Participation: moderate Quality of Participation: cooperative Interactions with others: gave feedback Mood/Affect: appropriate Triggers (if applicable): n/a Cognition: coherent/clear Progress: Moderate Response: n/a Plan: follow-up needed  Patients Problems:  Patient Active Problem List   Diagnosis Date Noted   ETOH abuse 09/20/2022   Head injury 09/20/2022   MDD (major depressive disorder), recurrent episode, moderate ([place]) 09/19/2022   Alcohol abuse with intoxication ([place]) 09/19/2022   Supraglottitis 11/12/2021   Laryngitis, acute 11/12/2021   Nicotine dependence 11/12/2021   Hepatitis C without hepatic coma 01/13/2019

## 2022-10-05 NOTE — ED Provider Notes (Addendum)
Behavioral Health Progress Note  Date and Time: 10/05/2022 2:13 PM Name: Ryan Petty MRN:  [id number]  Subjective:   Ryan Petty is a 57 yo male with little past psychiatric history, no documented behavioral health admissions. He presented to the behavioral urgent care requesting help with alcohol use. He was admitted to the facility based crisis.   RN notes overnight endorse that patient had a tearful event last night instigated by discussion with cohort. Patient was reminiscing his deceased girlfriend.  On assessment this AM patient reports that he he did become tearful yesterday, due to flashback of his girlfriend while talking about her. He did appear to become a bit dysphoric today when talking about her and what made him sad; however he reports that this was not the reason he did not sleep last night. Patient suddenly changed to being more irritable reporting that he felt frustrated by his lack of freedom on the unit. Patient endorsed that he was struggling with his voluntary presentation for Etoh tx. Patient reports that he feels like he signed himself for prison, but then mentioned that he felt prison gave him more freedom and rehabilitation that want he is expecting from rehab and the current detox facility. Patient then went to talk about the ways he could leave rehab early if he felt like it was not beneficial.   Patient and provider discussed that patient's current mindset would prevent him from completing rehab. Patient endorsed that the only time he attempted rehab in the past he left within 3 days. Patient endorsed he struggled with the rules at rehab facilities. Discussed with patient that he himself admitted that the reason he was looking for rehab was to have more structure and that the rules are what provide that. Patient did agree with this. Provider and patient attempt to discuss how mitigate patient leaving treatment early, but patient was very close-minded. Patient  ultimately agreed to write out the initial plan he had in mind when he brought himself to detox, and that he should at least stick to this plan as sticking to any plan at all would be growth for patient.    Patient denies SI, HI, and AVH and endorsed good appetite.   Information obtained on initial evaluation: The patient is a 57 year old male with little past psychiatric history, no documented behavioral health admissions.  He presented to the behavioral urgent care requesting help with alcohol use.  He was admitted to the facility based crisis.   On my evaluation 3/5, the patient appears uncomfortable and is rocking back and forth.  As soon as I sit down he says "can I have something to go to sleep".  The patient says he is exhausted and that only alcohol helps him go to sleep.  He states that he thinks either about his girlfriend who died 1 year ago or "nothing".  He denies experiencing hallucinations.  His only physical complaint is numbness in his feet.   The patient reports that he was in prison for 7.5 years for felony gun possession.  He states that he was released in 2019 and since that time has been living with his mother, who is now dying from cancer.  He states that his girlfriend died 1 year ago and that this was traumatic for him.  Since that time he has had trouble with alcohol use.  He reports that he drinks 2 or 3, 40 ounce beers per day.  His last drink was this morning.  He states that  he feels shaky if he does not have a drink of alcohol for an extended period of time.  He denies ever experiencing a seizure or DTs.  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  He states that he occasionally uses crack cocaine or THC.  He states that he has a friend named Elaine/Tara who drove him here to get help.  He also states that he has a new girlfriend who uses crack frequently.   The patient  reports experiencing depression and says that he has had suicidal thoughts with no plan.  He contracts for safety. Diagnosis:  Final diagnoses:  Alcohol abuse with intoxication ([place])    Total Time spent with patient: 30 minutes  Past Psychiatric History: as above Past Medical History: as above Family History: none Family Psychiatric  History: none Social History: as above and per H and P   Additional Social History:  See H and P  Additional Social History:    Pain Medications: See MAR Prescriptions: See MAR Over the Counter: See MAR History of alcohol / drug use?: Yes Longest period of sobriety (when/how long): 2 years Negative Consequences of Use: Personal relationships, Financial Withdrawal Symptoms: Tremors Name of Substance 1: alcohol 1 - Age of First Use: 7 1 - Amount (size/oz): minimum 2-3 40 oz beers 1 - Frequency: daily 1 - Duration: ongoing 1 - Last Use / Amount: 09/30/22 today 1 - Method of Aquiring: purchase 1- Route of Use: oral Name of Substance 2: cannabis 2 - Age of First Use: 7-10 2 - Amount (size/oz): 1 joint shared 2 - Frequency: weekly 2 - Duration: ongoing 2 - Last Use / Amount: 2-3 weeks ago 2 - Method of Aquiring: unknown 2 - Route of Substance Use: smoke Name of Substance 3: crack cocaine 3 - Age of First Use: teens 3 - Amount (size/oz): unknown amount shared 3 - Frequency: "every few months" 3 - Duration: ongoing 3 - Last Use / Amount: 1 month ago 3 - Method of Aquiring: unknown 3 - Route of Substance Use: smoke              Sleep: Poor  Appetite:  Fair  Current Medications:  Current Facility-Administered Medications  Medication Dose Route Frequency Provider Last Rate Last Admin   acetaminophen (TYLENOL) tablet 650 mg  650 mg Oral Q6H PRN Tharon Aquas, NP       alum & mag hydroxide-simeth (MAALOX/MYLANTA) 200-200-20 MG/5ML suspension 30 mL  30 mL Oral Q4H PRN Tharon Aquas, NP       gabapentin (NEURONTIN) capsule 200  mg  200 mg Oral BID Corky Sox, MD   200 mg at 10/05/22 0919   hydrOXYzine (ATARAX) tablet 25 mg  25 mg Oral TID PRN Ajibola, Ene A, NP   25 mg at 10/05/22 0920   magnesium hydroxide (MILK OF MAGNESIA) suspension 30 mL  30 mL Oral Daily PRN Tharon Aquas, NP       multivitamin with minerals tablet 1 tablet  1 tablet Oral Daily Corky Sox, MD   1 tablet at 10/05/22 0920   nicotine (NICODERM CQ - dosed in mg/24 hours) patch 21 mg  21 mg Transdermal Daily Tharon Aquas, NP   21 mg at 10/04/22 1021   thiamine (VITAMIN B1) tablet 100 mg  100 mg Oral Daily Corky Sox, MD   100 mg at  10/05/22 0919   traZODone (DESYREL) tablet 50 mg  50 mg Oral QHS PRN Corky Sox, MD   50 mg at 10/04/22 2113   No current outpatient medications on file.    Labs  Lab Results:  Admission on 09/30/2022  Component Date Value Ref Range Status   SARS Coronavirus 2 by RT PCR 09/30/2022 NEGATIVE  NEGATIVE Final   Influenza A by PCR 09/30/2022 NEGATIVE  NEGATIVE Final   Influenza B by PCR 09/30/2022 NEGATIVE  NEGATIVE Final   Comment: (NOTE) The Xpert Xpress SARS-CoV-2/FLU/RSV plus assay is intended as an aid in the diagnosis of influenza from Nasopharyngeal swab specimens and should not be used as a sole basis for treatment. Nasal washings and aspirates are unacceptable for Xpert Xpress SARS-CoV-2/FLU/RSV testing.  Fact Sheet for Patients: EntrepreneurPulse.com.au  Fact Sheet for Healthcare Providers: IncredibleEmployment.be  This test is not yet approved or cleared by the [place] FDA and has been authorized for detection and/or diagnosis of SARS-CoV-2 by FDA under an Emergency Use Authorization (EUA). This EUA will remain in effect (meaning this test can be used) for the duration of the COVID-19 declaration under Section 564(b)(1) of the Act, 21 U.S.C. section 360bbb-3(b)(1), unless the authorization is terminated or revoked.     Resp  Syncytial Virus by PCR 09/30/2022 NEGATIVE  NEGATIVE Final   Comment: (NOTE) Fact Sheet for Patients: EntrepreneurPulse.com.au  Fact Sheet for Healthcare Providers: IncredibleEmployment.be  This test is not yet approved or cleared by the [place] FDA and has been authorized for detection and/or diagnosis of SARS-CoV-2 by FDA under an Emergency Use Authorization (EUA). This EUA will remain in effect (meaning this test can be used) for the duration of the COVID-19 declaration under Section 564(b)(1) of the Act, 21 U.S.C. section 360bbb-3(b)(1), unless the authorization is terminated or revoked.  Performed at [facility], [place] [address], [place], [place] [address]    WBC 09/30/2022 4.4  4.0 - 10.5 K/uL Final   RBC 09/30/2022 4.11 (L)  4.22 - 5.81 MIL/uL Final   Hemoglobin 09/30/2022 14.7  13.0 - 17.0 g/dL Final   HCT 09/30/2022 39.9  39.0 - 52.0 % Final   MCV 09/30/2022 97.1  80.0 - 100.0 fL Final   MCH 09/30/2022 35.8 (H)  26.0 - 34.0 pg Final   MCHC 09/30/2022 36.8 (H)  30.0 - 36.0 g/dL Final   RDW 09/30/2022 15.7 (H)  11.5 - 15.5 % Final   Platelets 09/30/2022 126 (L)  150 - 400 K/uL Final   nRBC 09/30/2022 0.0  0.0 - 0.2 % Final   Neutrophils Relative % 09/30/2022 51  % Final   Neutro Abs 09/30/2022 2.2  1.7 - 7.7 K/uL Final   Lymphocytes Relative 09/30/2022 36  % Final   Lymphs Abs 09/30/2022 1.6  0.7 - 4.0 K/uL Final   Monocytes Relative 09/30/2022 11  % Final   Monocytes Absolute 09/30/2022 0.5  0.1 - 1.0 K/uL Final   Eosinophils Relative 09/30/2022 1  % Final   Eosinophils Absolute 09/30/2022 0.0  0.0 - 0.5 K/uL Final   Basophils Relative 09/30/2022 1  % Final   Basophils Absolute 09/30/2022 0.1  0.0 - 0.1 K/uL Final   Immature Granulocytes 09/30/2022 0  % Final   Abs Immature Granulocytes 09/30/2022 0.00  0.00 - 0.07 K/uL Final   Performed at [facility], [place] [address], [place], [place] [address]   Sodium  09/30/2022 140  135 - 145 mmol/L Final   Potassium  09/30/2022 4.1  3.5 - 5.1 mmol/L Final   Chloride 09/30/2022 100  98 - 111 mmol/L Final   CO2 09/30/2022 24  22 - 32 mmol/L Final   Glucose, Bld 09/30/2022 71  70 - 99 mg/dL Final   Glucose reference range applies only to samples taken after fasting for at least 8 hours.   BUN 09/30/2022 8  6 - 20 mg/dL Final   Creatinine, Ser 09/30/2022 1.01  0.61 - 1.24 mg/dL Final   Calcium 09/30/2022 8.5 (L)  8.9 - 10.3 mg/dL Final   Total Protein 09/30/2022 7.5  6.5 - 8.1 g/dL Final   Albumin 09/30/2022 4.2  3.5 - 5.0 g/dL Final   AST 09/30/2022 155 (H)  15 - 41 U/L Final   ALT 09/30/2022 78 (H)  0 - 44 U/L Final   Alkaline Phosphatase 09/30/2022 73  38 - 126 U/L Final   Total Bilirubin 09/30/2022 1.5 (H)  0.3 - 1.2 mg/dL Final   GFR, Estimated 09/30/2022 >60  >60 mL/min Final   Comment: (NOTE) Calculated using the CKD-EPI Creatinine Equation (2021)    Anion gap 09/30/2022 16 (H)  5 - 15 Final   Performed at [facility], [place] [address], [place], [place] [address]   Hgb A1c MFr Bld 09/30/2022 5.4  4.8 - 5.6 % Final   Comment: (NOTE)         Prediabetes: 5.7 - 6.4         Diabetes: >6.4         Glycemic control for adults with diabetes: <7.0    Mean Plasma Glucose 09/30/2022 108  mg/dL Final   Comment: (NOTE) Performed At: [facility] [place], [place] [id number] Rush Farmer MD [id number]    Alcohol, Ethyl (B) 09/30/2022 214 (H)  <10 mg/dL Final   Comment: (NOTE) Lowest detectable limit for serum alcohol is 10 mg/dL.  For medical purposes only. Performed at [facility], [place] [address] [place], [place] [address]    TSH 09/30/2022 0.590  0.350 - 4.500 uIU/mL Final   Comment: Performed by a 3rd Generation assay with a functional sensitivity of <=0.01 uIU/mL. Performed at [facility], [place] [address], [place], [place] [address]    POC Amphetamine UR 09/30/2022 None Detected  NONE DETECTED  (Cut Off Level 1000 ng/mL) Final   POC Secobarbital (BAR) 09/30/2022 None Detected  NONE DETECTED (Cut Off Level 300 ng/mL) Final   POC Buprenorphine (BUP) 09/30/2022 None Detected  NONE DETECTED (Cut Off Level 10 ng/mL) Final   POC Oxazepam (BZO) 09/30/2022 None Detected  NONE DETECTED (Cut Off Level 300 ng/mL) Final   POC Cocaine UR 09/30/2022 None Detected  NONE DETECTED (Cut Off Level 300 ng/mL) Final   POC Methamphetamine UR 09/30/2022 None Detected  NONE DETECTED (Cut Off Level 1000 ng/mL) Final   POC Morphine 09/30/2022 None Detected  NONE DETECTED (Cut Off Level 300 ng/mL) Final   POC Methadone UR 09/30/2022 None Detected  NONE DETECTED (Cut Off Level 300 ng/mL) Final   POC Oxycodone UR 09/30/2022 None Detected  NONE DETECTED (Cut Off Level 100 ng/mL) Final   POC Marijuana UR 09/30/2022 Positive (A)  NONE DETECTED (Cut Off Level 50 ng/mL) Final   SARSCOV2ONAVIRUS 2 AG 09/30/2022 NEGATIVE  NEGATIVE Final   Comment: (NOTE) SARS-CoV-2 antigen NOT DETECTED.   Negative results are presumptive.  Negative results do not preclude SARS-CoV-2 infection and should not be used as the sole basis for treatment or  other patient management decisions, including infection  control decisions, particularly in the presence of clinical signs and  symptoms consistent with COVID-19, or in those who have been in contact with the virus.  Negative results must be combined with clinical observations, patient history, and epidemiological information. The expected result is Negative.  Fact Sheet for Patients: HandmadeRecipes.com.cy  Fact Sheet for Healthcare Providers: FuneralLife.at  This test is not yet approved or cleared by the [place] FDA and  has been authorized for detection and/or diagnosis of SARS-CoV-2 by FDA under an Emergency Use Authorization (EUA).  This EUA will remain in effect (meaning this test can be used) for the duration of  the COV                           ID-19 declaration under Section 564(b)(1) of the Act, 21 U.S.C. section 360bbb-3(b)(1), unless the authorization is terminated or revoked sooner.     Cholesterol 09/30/2022 224 (H)  0 - 200 mg/dL Final   Triglycerides 09/30/2022 67  <150 mg/dL Final   HDL 09/30/2022 >135  >40 mg/dL Final   Total CHOL/HDL Ratio 09/30/2022 NOT CALCULATED  RATIO Final   VLDL 09/30/2022 13  0 - 40 mg/dL Final   LDL Cholesterol 09/30/2022 NOT CALCULATED  0 - 99 mg/dL Final   Performed at [facility] [address], [place], [place] [address]   Hepatitis B Surface Ag 10/03/2022 NON REACTIVE  NON REACTIVE Final   HCV Ab 10/03/2022 Reactive (A)  NON REACTIVE Final   Comment: (NOTE) The CDC recommends that a Reactive HCV antibody result be followed up  with a HCV Nucleic Acid Amplification test.     Hep A IgM 10/03/2022 NON REACTIVE  NON REACTIVE Final   Hep B C IgM 10/03/2022 NON REACTIVE  NON REACTIVE Final   Performed at [facility], [place] [address], [place], [place] [address]   Prothrombin Time 10/03/2022 11.7  11.4 - 15.2 seconds Final   INR 10/03/2022 0.9  0.8 - 1.2 Final   Comment: (NOTE) INR goal varies based on device and disease states. Performed at [facility], [place] [address], [place], [place] [address]   Admission on 09/19/2022, Discharged on 09/20/2022  Component Date Value Ref Range Status   WBC 09/19/2022 6.3  4.0 - 10.5 K/uL Final   RBC 09/19/2022 4.15 (L)  4.22 - 5.81 MIL/uL Final   Hemoglobin 09/19/2022 14.6  13.0 - 17.0 g/dL Final   HCT 09/19/2022 41.7  39.0 - 52.0 % Final   MCV 09/19/2022 100.5 (H)  80.0 - 100.0 fL Final   MCH 09/19/2022 35.2 (H)  26.0 - 34.0 pg Final   MCHC 09/19/2022 35.0  30.0 - 36.0 g/dL Final   RDW 09/19/2022 14.9  11.5 - 15.5 % Final   Platelets 09/19/2022 96 (L)  150 - 400 K/uL Final   nRBC 09/19/2022 0.0  0.0 - 0.2 % Final   Neutrophils Relative % 09/19/2022 55  % Final   Neutro Abs 09/19/2022 3.5  1.7 - 7.7  K/uL Final   Lymphocytes Relative 09/19/2022 31  % Final   Lymphs Abs 09/19/2022 1.9  0.7 - 4.0 K/uL Final   Monocytes Relative 09/19/2022 11  % Final   Monocytes Absolute 09/19/2022 0.7  0.1 - 1.0 K/uL Final   Eosinophils Relative 09/19/2022 1  % Final   Eosinophils Absolute 09/19/2022 0.1  0.0 - 0.5 K/uL Final  Basophils Relative 09/19/2022 1  % Final   Basophils Absolute 09/19/2022 0.1  0.0 - 0.1 K/uL Final   Immature Granulocytes 09/19/2022 1  % Final   Abs Immature Granulocytes 09/19/2022 0.03  0.00 - 0.07 K/uL Final   Performed at [facility], [place], [place] [address]   Color, Urine 09/19/2022 STRAW (A)  YELLOW Final   APPearance 09/19/2022 CLEAR (A)  CLEAR Final   Specific Gravity, Urine 09/19/2022 1.004 (L)  1.005 - 1.030 Final   pH 09/19/2022 5.0  5.0 - 8.0 Final   Glucose, UA 09/19/2022 NEGATIVE  NEGATIVE mg/dL Final   Hgb urine dipstick 09/19/2022 NEGATIVE  NEGATIVE Final   Bilirubin Urine 09/19/2022 NEGATIVE  NEGATIVE Final   Ketones, ur 09/19/2022 NEGATIVE  NEGATIVE mg/dL Final   Protein, ur 09/19/2022 NEGATIVE  NEGATIVE mg/dL Final   Nitrite 09/19/2022 NEGATIVE  NEGATIVE Final   Leukocytes,Ua 09/19/2022 NEGATIVE  NEGATIVE Final   Performed at [facility], [place] [place], [place] [id number]   Tricyclic, Ur Screen [id number] NONE DETECTED  NONE DETECTED Final   Amphetamines, Ur Screen 09/19/2022 NONE DETECTED  NONE DETECTED Final   MDMA (Ecstasy)Ur Screen 09/19/2022 NONE DETECTED  NONE DETECTED Final   Cocaine Metabolite,Ur Elsie 09/19/2022 NONE DETECTED  NONE DETECTED Final   Opiate, Ur Screen 09/19/2022 NONE DETECTED  NONE DETECTED Final   Phencyclidine (PCP) Ur S 09/19/2022 NONE DETECTED  NONE DETECTED Final   Cannabinoid 50 Ng, Ur Elim 09/19/2022 NONE DETECTED  NONE DETECTED Final   Barbiturates, Ur Screen 09/19/2022 NONE DETECTED  NONE DETECTED Final   Benzodiazepine, Ur Scrn 09/19/2022 NONE DETECTED  NONE DETECTED Final    Methadone Scn, Ur 09/19/2022 NONE DETECTED  NONE DETECTED Final   Comment: (NOTE) Tricyclics + metabolites, urine    Cutoff 1000 ng/mL Amphetamines + metabolites, urine  Cutoff 1000 ng/mL MDMA (Ecstasy), urine              Cutoff 500 ng/mL Cocaine Metabolite, urine          Cutoff 300 ng/mL Opiate + metabolites, urine        Cutoff 300 ng/mL Phencyclidine (PCP), urine         Cutoff 25 ng/mL Cannabinoid, urine                 Cutoff 50 ng/mL Barbiturates + metabolites, urine  Cutoff 200 ng/mL Benzodiazepine, urine              Cutoff 200 ng/mL Methadone, urine                   Cutoff 300 ng/mL  The urine drug screen provides only a preliminary, unconfirmed analytical test result and should not be used for non-medical purposes. Clinical consideration and professional judgment should be applied to any positive drug screen result due to possible interfering substances. A more specific alternate chemical method must be used in order to obtain a confirmed analytical result. Gas chromatography / mass spectrometry (GC/MS) is the preferred confirm                          atory method. Performed at [facility], [place], [place] [address]    Troponin I (High Sensitivity) 09/19/2022 6  <18 ng/L Final   Comment: (NOTE) Elevated high sensitivity troponin I (hsTnI) values and significant  changes across serial measurements may suggest ACS but many other  chronic and  acute conditions are known to elevate hsTnI results.  Refer to the Links section for chest pain algorithms and additional  guidance. Performed at [facility], [place] [employer], [place] [address]    SARS Coronavirus 2 by RT PCR 09/19/2022 NEGATIVE  NEGATIVE Final   Comment: (NOTE) SARS-CoV-2 target nucleic acids are NOT DETECTED.  The SARS-CoV-2 RNA is generally detectable in upper respiratory specimens during the acute phase of infection. The lowest concentration of SARS-CoV-2  viral copies this assay can detect is 138 copies/mL. A negative result does not preclude SARS-Cov-2 infection and should not be used as the sole basis for treatment or other patient management decisions. A negative result may occur with  improper specimen collection/handling, submission of specimen other than nasopharyngeal swab, presence of viral mutation(s) within the areas targeted by this assay, and inadequate number of viral copies(<138 copies/mL). A negative result must be combined with clinical observations, patient history, and epidemiological information. The expected result is Negative.  Fact Sheet for Patients:  EntrepreneurPulse.com.au  Fact Sheet for Healthcare Providers:  IncredibleEmployment.be  This test is no                          t yet approved or cleared by the [place] FDA and  has been authorized for detection and/or diagnosis of SARS-CoV-2 by FDA under an Emergency Use Authorization (EUA). This EUA will remain  in effect (meaning this test can be used) for the duration of the COVID-19 declaration under Section 564(b)(1) of the Act, 21 U.S.C.section 360bbb-3(b)(1), unless the authorization is terminated  or revoked sooner.       Influenza A by PCR 09/19/2022 NEGATIVE  NEGATIVE Final   Influenza B by PCR 09/19/2022 NEGATIVE  NEGATIVE Final   Comment: (NOTE) The Xpert Xpress SARS-CoV-2/FLU/RSV plus assay is intended as an aid in the diagnosis of influenza from Nasopharyngeal swab specimens and should not be used as a sole basis for treatment. Nasal washings and aspirates are unacceptable for Xpert Xpress SARS-CoV-2/FLU/RSV testing.  Fact Sheet for Patients: EntrepreneurPulse.com.au  Fact Sheet for Healthcare Providers: IncredibleEmployment.be  This test is not yet approved or cleared by the [place] FDA and has been authorized for detection and/or diagnosis of SARS-CoV-2  by FDA under an Emergency Use Authorization (EUA). This EUA will remain in effect (meaning this test can be used) for the duration of the COVID-19 declaration under Section 564(b)(1) of the Act, 21 U.S.C. section 360bbb-3(b)(1), unless the authorization is terminated or revoked.     Resp Syncytial Virus by PCR 09/19/2022 NEGATIVE  NEGATIVE Final   Comment: (NOTE) Fact Sheet for Patients: EntrepreneurPulse.com.au  Fact Sheet for Healthcare Providers: IncredibleEmployment.be  This test is not yet approved or cleared by the [place] FDA and has been authorized for detection and/or diagnosis of SARS-CoV-2 by FDA under an Emergency Use Authorization (EUA). This EUA will remain in effect (meaning this test can be used) for the duration of the COVID-19 declaration under Section 564(b)(1) of the Act, 21 U.S.C. section 360bbb-3(b)(1), unless the authorization is terminated or revoked.  Performed at [facility], [address] [place], [place] [address]    Group A Strep by PCR 09/19/2022 NOT DETECTED  NOT DETECTED Final   Performed at [facility], [place], [place] [address]   Sodium 09/19/2022 143  135 - 145 mmol/L Final   Potassium 09/19/2022 3.9  3.5 - 5.1 mmol/L Final   Chloride  09/19/2022 106  98 - 111 mmol/L Final   CO2 09/19/2022 25  22 - 32 mmol/L Final   Glucose, Bld 09/19/2022 83  70 - 99 mg/dL Final   Glucose reference range applies only to samples taken after fasting for at least 8 hours.   BUN 09/19/2022 8  6 - 20 mg/dL Final   Creatinine, Ser 09/19/2022 0.92  0.61 - 1.24 mg/dL Final   Calcium 09/19/2022 7.9 (L)  8.9 - 10.3 mg/dL Final   Total Protein 09/19/2022 7.0  6.5 - 8.1 g/dL Final   Albumin 09/19/2022 3.8  3.5 - 5.0 g/dL Final   AST 09/19/2022 97 (H)  15 - 41 U/L Final   ALT 09/19/2022 48 (H)  0 - 44 U/L Final   Alkaline Phosphatase 09/19/2022 60  38 - 126 U/L Final   Total Bilirubin  09/19/2022 0.9  0.3 - 1.2 mg/dL Final   GFR, Estimated 09/19/2022 >60  >60 mL/min Final   Comment: (NOTE) Calculated using the CKD-EPI Creatinine Equation (2021)    Anion gap 09/19/2022 12  5 - 15 Final   Performed at [facility], [place] [place], [place] [address]   Lipase 09/19/2022 51  11 - 51 U/L Final   Performed at [facility], [place] [place], [place] [address]   Troponin I (High Sensitivity) 09/19/2022 8  <18 ng/L Final   Comment: (NOTE) Elevated high sensitivity troponin I (hsTnI) values and significant  changes across serial measurements may suggest ACS but many other  chronic and acute conditions are known to elevate hsTnI results.  Refer to the "Links" section for chest pain algorithms and additional  guidance. Performed at [facility], [place] [place], [place] [address]   Admission on 09/15/2022, Discharged on 09/15/2022  Component Date Value Ref Range Status   WBC 09/15/2022 4.9  4.0 - 10.5 K/uL Final   RBC 09/15/2022 4.09 (L)  4.22 - 5.81 MIL/uL Final   Hemoglobin 09/15/2022 14.5  13.0 - 17.0 g/dL Final   HCT 09/15/2022 39.9  39.0 - 52.0 % Final   MCV 09/15/2022 97.6  80.0 - 100.0 fL Final   MCH 09/15/2022 35.5 (H)  26.0 - 34.0 pg Final   MCHC 09/15/2022 36.3 (H)  30.0 - 36.0 g/dL Final   RDW 09/15/2022 13.9  11.5 - 15.5 % Final   Platelets 09/15/2022 102 (L)  150 - 400 K/uL Final   nRBC 09/15/2022 0.0  0.0 - 0.2 % Final   Neutrophils Relative % 09/15/2022 37  % Final   Neutro Abs 09/15/2022 1.8  1.7 - 7.7 K/uL Final   Lymphocytes Relative 09/15/2022 49  % Final   Lymphs Abs 09/15/2022 2.4  0.7 - 4.0 K/uL Final   Monocytes Relative 09/15/2022 13  % Final   Monocytes Absolute 09/15/2022 0.6  0.1 - 1.0 K/uL Final   Eosinophils Relative 09/15/2022 0  % Final   Eosinophils Absolute 09/15/2022 0.0  0.0 - 0.5 K/uL Final   Basophils Relative 09/15/2022 1  % Final   Basophils Absolute 09/15/2022 0.1  0.0 - 0.1  K/uL Final   Immature Granulocytes 09/15/2022 0  % Final   Abs Immature Granulocytes 09/15/2022 0.00  0.00 - 0.07 K/uL Final   Performed at [facility], [place], [place] [address]   Sodium 09/15/2022 136  135 - 145 mmol/L Final   Potassium 09/15/2022 3.7  3.5 - 5.1 mmol/L Final   Chloride 09/15/2022 99  98 - 111 mmol/L Final   CO2 09/15/2022 23  22 - 32 mmol/L Final   Glucose, Bld 09/15/2022 107 (H)  70 - 99 mg/dL Final   Glucose reference range applies only to samples taken after fasting for at least 8 hours.   BUN 09/15/2022 7  6 - 20 mg/dL Final   Creatinine, Ser 09/15/2022 0.83  0.61 - 1.24 mg/dL Final   Calcium 09/15/2022 8.5 (L)  8.9 - 10.3 mg/dL Final   Total Protein 09/15/2022 7.7  6.5 - 8.1 g/dL Final   Albumin 09/15/2022 4.1  3.5 - 5.0 g/dL Final   AST 09/15/2022 89 (H)  15 - 41 U/L Final   ALT 09/15/2022 54 (H)  0 - 44 U/L Final   Alkaline Phosphatase 09/15/2022 67  38 - 126 U/L Final   Total Bilirubin 09/15/2022 1.2  0.3 - 1.2 mg/dL Final   GFR, Estimated 09/15/2022 >60  >60 mL/min Final   Comment: (NOTE) Calculated using the CKD-EPI Creatinine Equation (2021)    Anion gap 09/15/2022 14  5 - 15 Final   Performed at [facility], [place], [place] [address]   Alcohol, Ethyl (B) 09/15/2022 313 (HH)  <10 mg/dL Final   Comment: CRITICAL RESULT CALLED TO, READ BACK BY AND VERIFIED WITH AMBER PAYNE AT 1722 ON 09/15/22 BY SS (NOTE) Lowest detectable limit for serum alcohol is 10 mg/dL.  For medical purposes only. Performed at [facility], [place], [place] [address]    Color, Urine 09/15/2022 YELLOW (A)  YELLOW Final   APPearance 09/15/2022 CLEAR (A)  CLEAR Final   Specific Gravity, Urine 09/15/2022 1.005  1.005 - 1.030 Final   pH 09/15/2022 6.0  5.0 - 8.0 Final   Glucose, UA 09/15/2022 NEGATIVE  NEGATIVE mg/dL Final   Hgb urine dipstick 09/15/2022 NEGATIVE  NEGATIVE Final   Bilirubin Urine  09/15/2022 NEGATIVE  NEGATIVE Final   Ketones, ur 09/15/2022 NEGATIVE  NEGATIVE mg/dL Final   Protein, ur 09/15/2022 NEGATIVE  NEGATIVE mg/dL Final   Nitrite 09/15/2022 NEGATIVE  NEGATIVE Final   Leukocytes,Ua 09/15/2022 NEGATIVE  NEGATIVE Final   Performed at [facility], [address] [place], [place] [id number]   Tricyclic, Ur Screen 0000000 NONE DETECTED  NONE DETECTED Final   Amphetamines, Ur Screen 09/15/2022 NONE DETECTED  NONE DETECTED Final   MDMA (Ecstasy)Ur Screen 09/15/2022 NONE DETECTED  NONE DETECTED Final   Cocaine Metabolite,Ur [place] 09/15/2022 NONE DETECTED  NONE DETECTED Final   Opiate, Ur Screen 09/15/2022 NONE DETECTED  NONE DETECTED Final   Phencyclidine (PCP) Ur S 09/15/2022 NONE DETECTED  NONE DETECTED Final   Cannabinoid 50 Ng, Ur [place] 09/15/2022 NONE DETECTED  NONE DETECTED Final   Barbiturates, Ur Screen 09/15/2022 NONE DETECTED  NONE DETECTED Final   Benzodiazepine, Ur Scrn 09/15/2022 NONE DETECTED  NONE DETECTED Final   Methadone Scn, Ur 09/15/2022 NONE DETECTED  NONE DETECTED Final   Comment: (NOTE) Tricyclics + metabolites, urine    Cutoff 1000 ng/mL Amphetamines + metabolites, urine  Cutoff 1000 ng/mL MDMA (Ecstasy), urine              Cutoff 500 ng/mL Cocaine Metabolite, urine          Cutoff 300 ng/mL Opiate + metabolites, urine        Cutoff 300 ng/mL Phencyclidine (PCP), urine         Cutoff 25 ng/mL Cannabinoid, urine  Cutoff 50 ng/mL Barbiturates + metabolites, urine  Cutoff 200 ng/mL Benzodiazepine, urine              Cutoff 200 ng/mL Methadone, urine                   Cutoff 300 ng/mL  The urine drug screen provides only a preliminary, unconfirmed analytical test result and should not be used for non-medical purposes. Clinical consideration and professional judgment should be applied to any positive drug screen result due to possible interfering substances. A more specific alternate chemical method must be used in order  to obtain a confirmed analytical result. Gas chromatography / mass spectrometry (GC/MS) is the preferred confirm                          atory method. Performed at [facility], [address] [place], [place] [address]     Blood Alcohol level:  Lab Results  Component Value Date   ETH 214 (H) 09/30/2022   ETH 313 (HH) 0000000    Metabolic Disorder Labs: Lab Results  Component Value Date   HGBA1C 5.4 09/30/2022   MPG 108 09/30/2022   No results found for: "PROLACTIN" Lab Results  Component Value Date   CHOL 224 (H) 09/30/2022   TRIG 67 09/30/2022   HDL >135 09/30/2022   CHOLHDL NOT CALCULATED 09/30/2022   VLDL 13 09/30/2022   LDLCALC NOT CALCULATED 09/30/2022    Therapeutic Lab Levels: No results found for: "LITHIUM" No results found for: "VALPROATE" No results found for: "CBMZ"  Physical Findings   PHQ2-9    Flowsheet Row ED from 09/30/2022 in [facility]  PHQ-2 Total Score 3  PHQ-9 Total Score 16      [place] ED from 09/30/2022 in [facility] from 09/19/2022 in [facility] Emergency Department at [facility] ED from 09/15/2022 in [facility] Emergency Department at [place] No Risk Low Risk No Risk        Musculoskeletal  Strength & Muscle Tone: within normal limits Gait & Station: normal Patient leans: N/A  Psychiatric Specialty Exam  Presentation  General Appearance:  Casual  Eye Contact: Good  Speech: Clear and Coherent  Speech Volume: Normal  Handedness: Right   Mood and Affect  Mood: Irritable  Affect: Congruent   Thought Process  Thought Processes: Linear  Descriptions of Associations:Intact  Orientation:Full (Time, Place and Person)  Thought Content:Rumination  Diagnosis of Schizophrenia or Schizoaffective disorder in past: No    Hallucinations:Hallucinations: None  Ideas of Reference:None  Suicidal  Thoughts:Suicidal Thoughts: No  Homicidal Thoughts:Homicidal Thoughts: No   Sensorium  Memory: Immediate Fair; Recent Fair  Judgment: Poor (poor compared to yesterday)  Insight: Fair   Community education officer  Concentration: Fair  Attention Span: Fair  Recall: Roel Cluck of Knowledge: Fair  Language: Fair   Psychomotor Activity  Psychomotor Activity: Psychomotor Activity: Normal   Assets  Assets: Communication Skills; Desire for Improvement; Social Support; Housing   Sleep  Sleep: Sleep: Poor   No data recorded  Physical Exam  Physical Exam HENT:     Head: Normocephalic and atraumatic.  Pulmonary:     Effort: Pulmonary effort is normal.  Neurological:     Mental Status: He is alert and oriented to person, place, and time.    ROS Blood pressure 124/80, pulse 80, temperature 98.5 F (36.9 C), temperature source  Oral, resp. rate 18, SpO2 99 %. There is no height or weight on file to calculate BMI.  Treatment Plan Summary: Daily contact with patient to assess and evaluate symptoms and progress in treatment and Medication management   Assessment today appeared to display patient's hx of poor compliance with treatment plans. Patient was struggling with sticking to a plan and was creating multiple "escape routes" and contingencies. Patient appeared to have 2 different thought process, where he believed he could be sober and was confident in this and the other was regretting seeking treatment and anxious about going to rehab. Patient's ruminative thoughts and linear thought process were self-sabotaging. Patient's ability to stick to one plan (even if it is own) would be growth as patient struggles with commitment.    Alcohol use disorder, severe, active withdrawal, no history of severe withdrawal -- Ativan taper completed, 10/03/22 - Continue gabapentin '200mg'$  BID - As needed trazodone '50mg'$  for sleep - Patient will engage in detox and consider residential  rehab -- Assignment: Write treatment plan goals that he will commit to, completed   Medical: - Routine labs notable for decreased platelets and increased T. bili consistent with impaired hepatic function - Thiamine - Hypertension and tachycardia, suspect secondary to withdrawal (resolved) - INR (WNL) and hepatitis panel ( Hep C positive)     PGY-3 Freida Busman, MD 10/05/2022 2:13 PM

## 2022-10-05 NOTE — ED Notes (Signed)
Pt awake, reading book. No noted distress. Will continue to monitor for safety

## 2022-10-05 NOTE — ED Notes (Signed)
Patient is currently sitting in his room, no distress noted, will continue to monitor patient for safety

## 2022-10-05 NOTE — ED Notes (Signed)
Pt awake in room. Reading. C/o mind racing, unable to sleep. Notified provider for PRN med

## 2022-10-05 NOTE — ED Notes (Signed)
Pt is awake and alert. He has been visible in milieu and eating at meal time.  Pt denies SI, HI or AVH.  Pt does have a Sad affect and depressed mood. Reports that he " only slept about twenty minutes last night".  Pt reports sadness and feelings of loss from deceased girlfriend.   Pt does state he is going to ask the provider for something else to "help me sleep tonight".   Staff will cont to monitor for safety.

## 2022-10-05 NOTE — ED Notes (Signed)
Outside with nurse

## 2022-10-06 DIAGNOSIS — F10229 Alcohol dependence with intoxication, unspecified: Secondary | ICD-10-CM | POA: Diagnosis not present

## 2022-10-06 DIAGNOSIS — R5383 Other fatigue: Secondary | ICD-10-CM | POA: Diagnosis not present

## 2022-10-06 DIAGNOSIS — R200 Anesthesia of skin: Secondary | ICD-10-CM | POA: Diagnosis not present

## 2022-10-06 DIAGNOSIS — Z1152 Encounter for screening for COVID-19: Secondary | ICD-10-CM | POA: Diagnosis not present

## 2022-10-06 NOTE — ED Notes (Signed)
Pt is in the Dayroom watching TV. Respirations are even and unlabored. No acute distress noted. Will continue to monitor for safety.

## 2022-10-06 NOTE — ED Notes (Signed)
Patient awake and alert on unit without distress or complaint.  Patient is calm and cooperative with care, meds and meals.  No somatic complaint.  Will monitor.

## 2022-10-06 NOTE — ED Notes (Signed)
Pt is in the bed sleeping. Respirations are even and unlabored. No acute distress noted. Will continue to monitor for safety. 

## 2022-10-06 NOTE — ED Notes (Addendum)
Pt is in the Dayroom watching TV. Respirations are even and unlabored. No acute distress noted. Will continue to monitor for safety.

## 2022-10-06 NOTE — ED Notes (Signed)
Pt is currently sleeping, no distress noted, environmental check complete, will continue to monitor patient for safety. ? ?

## 2022-10-06 NOTE — Discharge Planning (Signed)
Patient completed interview with Admissions Representative Pershing Cox at [facility]. Per Milbert Coulter, patient would need to cancel his medicaid in order to come to their facility for treatment. Patient can then get back on the Medicaid after treatment has been complete. LCSW has conversation with patient and patient reports he would prefer not to come off of his medicaid at this time. Patient did report only an interest in facilities within the surrounding area of [place]. Patient aware that options are limited due to insurance. Patient explored if the option of [facility] was still available in [facility]. Patient aware that LCSW will follow up with [facility] regarding scheduling an intake appt.   LCSW contacted [facility] in [place] regarding patient's interest in [place]. Per Admissions Coordinator Asha, patient would need to present to the agency during open access hours Monday, Wednesday, Friday from 8:00am-3:00pm. Unable to arrange an appt at this time. Patient encouraged to report at his earliest convenience to inform agency of his interest in [place]. Patient did inquire about treatment at [facility]. LCSW with [place] in Admissions to see if insurance is accepted. Per [place], insurance would not allow a transfer to their facility due to there being no medical necessity. [facility] would be considered treatment from [facility] Medicaid.   Lucius Conn, LCSW Clinical Social Worker [facility] Ph: [phone]

## 2022-10-06 NOTE — ED Notes (Signed)
Snacks given 

## 2022-10-06 NOTE — ED Notes (Signed)
Pt in [place]

## 2022-10-06 NOTE — ED Provider Notes (Cosign Needed Addendum)
Behavioral Health Progress Note  Date and Time: 10/06/2022 2:15 PM Name: JERET BERL MRN:  [id number]  HPI:  Loman Ismaili is a 57 yo male who initially presented to the [facility] on 09/30/2022 requesting help with alcohol use. He was admitted to the facility based crisis.    Per admission not on 3/5  by Elvin So NP, "t reports alcohol use daily. Reports his last use of alcohol was 1 40oz this morning. He reports he drinks "whatever I can afford". He reports he drinks between 1 to 4 40oz beers/day. Pt reports use of crack/cocaine occasionally. He reports last use of crack/cocaine was at least over 1 month ago. Pt reports use of marijuana occasionally. He reports last use of marijuana was 2 or 3 weeks ago. Pt reports daily use of cigarettes, 1 ppd. He reports last cigarette was couple hours ago. Pt denies use of methamphetamines, heroin, fentanyl, other substances. Pt denies current withdrawal symptoms. He denies history of delirium tremens or withdrawal seizures. "   Subjective:   Renee Harder, 57 y.o., male patient seen face to face by this provider and chart reviewed on 10/06/22.  Per chart review pt has  pending court date on 5/21 for DUI. He reports he got pulled over for a DUI 3 to 4 weeks ago. He reports he was previously in prison for felony possession of a firearm. He reports he was in prison for 7.5 years and was discharged in 2019.    UDS on admission is positive for marijuana.  EtOH was  214.  He is denying any withdrawal symptoms at this time.  On today's evaluation AARON MALKIN is observed watching TV in the day room in no acute distress.  He is casually dressed and makes good eye contact.  He is alert/oriented x 4, cooperative, and attentive.  He has normal speech and behavior.  He denies any depressive symptoms.  He has a euthymic affect.  He anxiety at times.  States he is ready to be discharged in the a.m.  His plan is to return home to his mother's house.  He is  interested in intensive outpatient therapy for substance abuse.  Reports he has had difficulty sleeping but was able to sleep through the night last night.  He denies any concerns with appetite.  He feels determined to maintain his sobriety.  States, "when I set my mind to something I do it".  He denies SI/HI/AVH.  He verbally contracts for safety.  He denies access to firearms/weapons.  He does not appear to be responding to internal/external stimuli.    Discussed discharge planning for 10/07/2022.  Per SW note, "LCSW contacted [facility] in [place] regarding patient's interest in [place]. Per Admissions Coordinator Asha, patient would need to present to the agency during open access hours Monday, Wednesday, Friday from 8:00am-3:00pm. Unable to arrange an appt at this time. Patient encouraged to report at his earliest convenience to inform agency of his interest in [place]".    Diagnosis:  Final diagnoses:  Alcohol abuse with intoxication ([address])    Total Time spent with patient: 30 minutes  Past Psychiatric History: as documented in H&P Past Medical History: As documented in H&P Family History: As documented in H&P Family Psychiatric  History: As documented in H&P Social History: As documented in H&P  Additional Social History:    Pain Medications: See MAR Prescriptions: See MAR Over the Counter: See MAR History of alcohol / drug use?: Yes Longest period of  sobriety (when/how long): 2 years Negative Consequences of Use: Personal relationships, Financial Withdrawal Symptoms: Tremors Name of Substance 1: alcohol 1 - Age of First Use: 7 1 - Amount (size/oz): minimum 2-3 40 oz beers 1 - Frequency: daily 1 - Duration: ongoing 1 - Last Use / Amount: 09/30/22 today 1 - Method of Aquiring: purchase 1- Route of Use: oral Name of Substance 2: cannabis 2 - Age of First Use: 7-10 2 - Amount (size/oz): 1 joint shared 2 - Frequency: weekly 2 - Duration: ongoing 2 - Last Use / Amount:  2-3 weeks ago 2 - Method of Aquiring: unknown 2 - Route of Substance Use: smoke Name of Substance 3: crack cocaine 3 - Age of First Use: teens 3 - Amount (size/oz): unknown amount shared 3 - Frequency: "every few months" 3 - Duration: ongoing 3 - Last Use / Amount: 1 month ago 3 - Method of Aquiring: unknown 3 - Route of Substance Use: smoke              Sleep: Good  Appetite:  Good  Current Medications:  Current Facility-Administered Medications  Medication Dose Route Frequency Provider Last Rate Last Admin   acetaminophen (TYLENOL) tablet 650 mg  650 mg Oral Q6H PRN Tharon Aquas, NP       alum & mag hydroxide-simeth (MAALOX/MYLANTA) 200-200-20 MG/5ML suspension 30 mL  30 mL Oral Q4H PRN Tharon Aquas, NP       gabapentin (NEURONTIN) capsule 200 mg  200 mg Oral BID Corky Sox, MD   200 mg at 10/06/22 [id number]   hydrOXYzine (ATARAX) tablet 25 mg  25 mg Oral TID PRN Ajibola, Ene A, NP   25 mg at 10/05/22 2123   magnesium hydroxide (MILK OF MAGNESIA) suspension 30 mL  30 mL Oral Daily PRN Tharon Aquas, NP       multivitamin with minerals tablet 1 tablet  1 tablet Oral Daily Corky Sox, MD   1 tablet at 10/06/22 [id number]   nicotine (NICODERM CQ - dosed in mg/24 hours) patch 21 mg  21 mg Transdermal Daily Tharon Aquas, NP   21 mg at 10/06/22 [id number]   thiamine (VITAMIN B1) tablet 100 mg  100 mg Oral Daily Corky Sox, MD   100 mg at 10/06/22 0937   traZODone (DESYREL) tablet 50 mg  50 mg Oral QHS PRN Corky Sox, MD   50 mg at 10/05/22 2123   No current outpatient medications on file.    Labs  Lab Results:  Admission on 09/30/2022  Component Date Value Ref Range Status   SARS Coronavirus 2 by RT PCR 09/30/2022 NEGATIVE  NEGATIVE Final   Influenza A by PCR 09/30/2022 NEGATIVE  NEGATIVE Final   Influenza B by PCR 09/30/2022 NEGATIVE  NEGATIVE Final   Comment: (NOTE) The Xpert Xpress SARS-CoV-2/FLU/RSV plus assay is intended as an aid in the  diagnosis of influenza from Nasopharyngeal swab specimens and should not be used as a sole basis for treatment. Nasal washings and aspirates are unacceptable for Xpert Xpress SARS-CoV-2/FLU/RSV testing.  Fact Sheet for Patients: EntrepreneurPulse.com.au  Fact Sheet for Healthcare Providers: IncredibleEmployment.be  This test is not yet approved or cleared by the [place] FDA and has been authorized for detection and/or diagnosis of SARS-CoV-2 by FDA under an Emergency Use Authorization (EUA). This EUA will remain in effect (meaning this test can be used) for the duration of the COVID-19 declaration under Section 564(b)(1) of the Act, 21 U.S.C. section 360bbb-3(b)(1), unless the  authorization is terminated or revoked.     Resp Syncytial Virus by PCR 09/30/2022 NEGATIVE  NEGATIVE Final   Comment: (NOTE) Fact Sheet for Patients: EntrepreneurPulse.com.au  Fact Sheet for Healthcare Providers: IncredibleEmployment.be  This test is not yet approved or cleared by the [place] FDA and has been authorized for detection and/or diagnosis of SARS-CoV-2 by FDA under an Emergency Use Authorization (EUA). This EUA will remain in effect (meaning this test can be used) for the duration of the COVID-19 declaration under Section 564(b)(1) of the Act, 21 U.S.C. section 360bbb-3(b)(1), unless the authorization is terminated or revoked.  Performed at [facility], [place] [address], [place], [place] [address]    WBC 09/30/2022 4.4  4.0 - 10.5 K/uL Final   RBC 09/30/2022 4.11 (L)  4.22 - 5.81 MIL/uL Final   Hemoglobin 09/30/2022 14.7  13.0 - 17.0 g/dL Final   HCT 09/30/2022 39.9  39.0 - 52.0 % Final   MCV 09/30/2022 97.1  80.0 - 100.0 fL Final   MCH 09/30/2022 35.8 (H)  26.0 - 34.0 pg Final   MCHC 09/30/2022 36.8 (H)  30.0 - 36.0 g/dL Final   RDW 09/30/2022 15.7 (H)  11.5 - 15.5 % Final   Platelets 09/30/2022  126 (L)  150 - 400 K/uL Final   nRBC 09/30/2022 0.0  0.0 - 0.2 % Final   Neutrophils Relative % 09/30/2022 51  % Final   Neutro Abs 09/30/2022 2.2  1.7 - 7.7 K/uL Final   Lymphocytes Relative 09/30/2022 36  % Final   Lymphs Abs 09/30/2022 1.6  0.7 - 4.0 K/uL Final   Monocytes Relative 09/30/2022 11  % Final   Monocytes Absolute 09/30/2022 0.5  0.1 - 1.0 K/uL Final   Eosinophils Relative 09/30/2022 1  % Final   Eosinophils Absolute 09/30/2022 0.0  0.0 - 0.5 K/uL Final   Basophils Relative 09/30/2022 1  % Final   Basophils Absolute 09/30/2022 0.1  0.0 - 0.1 K/uL Final   Immature Granulocytes 09/30/2022 0  % Final   Abs Immature Granulocytes 09/30/2022 0.00  0.00 - 0.07 K/uL Final   Performed at [facility], [place] [address], [place], [place] [address]   Sodium 09/30/2022 140  135 - 145 mmol/L Final   Potassium 09/30/2022 4.1  3.5 - 5.1 mmol/L Final   Chloride 09/30/2022 100  98 - 111 mmol/L Final   CO2 09/30/2022 24  22 - 32 mmol/L Final   Glucose, Bld 09/30/2022 71  70 - 99 mg/dL Final   Glucose reference range applies only to samples taken after fasting for at least 8 hours.   BUN 09/30/2022 8  6 - 20 mg/dL Final   Creatinine, Ser 09/30/2022 1.01  0.61 - 1.24 mg/dL Final   Calcium 09/30/2022 8.5 (L)  8.9 - 10.3 mg/dL Final   Total Protein 09/30/2022 7.5  6.5 - 8.1 g/dL Final   Albumin 09/30/2022 4.2  3.5 - 5.0 g/dL Final   AST 09/30/2022 155 (H)  15 - 41 U/L Final   ALT 09/30/2022 78 (H)  0 - 44 U/L Final   Alkaline Phosphatase 09/30/2022 73  38 - 126 U/L Final   Total Bilirubin 09/30/2022 1.5 (H)  0.3 - 1.2 mg/dL Final   GFR, Estimated 09/30/2022 >60  >60 mL/min Final   Comment: (NOTE) Calculated using the CKD-EPI Creatinine Equation (2021)    Anion gap 09/30/2022 16 (H)  5 - 15 Final   Performed at [facility], [address] [place], [place] [address]   Hgb A1c MFr Bld 09/30/2022 5.4  4.8 - 5.6 % Final   Comment: (NOTE)         Prediabetes: 5.7 - 6.4          Diabetes: >6.4         Glycemic control for adults with diabetes: <7.0    Mean Plasma Glucose 09/30/2022 108  mg/dL Final   Comment: (NOTE) Performed At: [facility] [place], [place] [id number] Rush Farmer MD [id number]    Alcohol, Ethyl (B) 09/30/2022 214 (H)  <10 mg/dL Final   Comment: (NOTE) Lowest detectable limit for serum alcohol is 10 mg/dL.  For medical purposes only. Performed at [facility], [place] [address], [place], [place] [address]    TSH 09/30/2022 0.590  0.350 - 4.500 uIU/mL Final   Comment: Performed by a 3rd Generation assay with a functional sensitivity of <=0.01 uIU/mL. Performed at [facility], [place] [address], [place], [place] [address]    POC Amphetamine UR 09/30/2022 None Detected  NONE DETECTED (Cut Off Level 1000 ng/mL) Final   POC Secobarbital (BAR) 09/30/2022 None Detected  NONE DETECTED (Cut Off Level 300 ng/mL) Final   POC Buprenorphine (BUP) 09/30/2022 None Detected  NONE DETECTED (Cut Off Level 10 ng/mL) Final   POC Oxazepam (BZO) 09/30/2022 None Detected  NONE DETECTED (Cut Off Level 300 ng/mL) Final   POC Cocaine UR 09/30/2022 None Detected  NONE DETECTED (Cut Off Level 300 ng/mL) Final   POC Methamphetamine UR 09/30/2022 None Detected  NONE DETECTED (Cut Off Level 1000 ng/mL) Final   POC Morphine 09/30/2022 None Detected  NONE DETECTED (Cut Off Level 300 ng/mL) Final   POC Methadone UR 09/30/2022 None Detected  NONE DETECTED (Cut Off Level 300 ng/mL) Final   POC Oxycodone UR 09/30/2022 None Detected  NONE DETECTED (Cut Off Level 100 ng/mL) Final   POC Marijuana UR 09/30/2022 Positive (A)  NONE DETECTED (Cut Off Level 50 ng/mL) Final   SARSCOV2ONAVIRUS 2 AG 09/30/2022 NEGATIVE  NEGATIVE Final   Comment: (NOTE) SARS-CoV-2 antigen NOT DETECTED.   Negative results are presumptive.  Negative results do not preclude SARS-CoV-2 infection and should not be used as the sole basis for treatment or other patient  management decisions, including infection  control decisions, particularly in the presence of clinical signs and  symptoms consistent with COVID-19, or in those who have been in contact with the virus.  Negative results must be combined with clinical observations, patient history, and epidemiological information. The expected result is Negative.  Fact Sheet for Patients: HandmadeRecipes.com.cy  Fact Sheet for Healthcare Providers: FuneralLife.at  This test is not yet approved or cleared by the [place] FDA and  has been authorized for detection and/or diagnosis of SARS-CoV-2 by FDA under an Emergency Use Authorization (EUA).  This EUA will remain in effect (meaning this test can be used) for the duration of  the COV                          ID-19 declaration under Section 564(b)(1) of the Act, 21 U.S.C. section 360bbb-3(b)(1), unless the authorization is terminated or revoked sooner.     Cholesterol 09/30/2022 224 (H)  0 - 200 mg/dL Final   Triglycerides 09/30/2022 67  <150 mg/dL Final   HDL 09/30/2022 >135  >40 mg/dL Final   Total CHOL/HDL Ratio 09/30/2022 NOT CALCULATED  RATIO Final  VLDL 09/30/2022 13  0 - 40 mg/dL Final   LDL Cholesterol 09/30/2022 NOT CALCULATED  0 - 99 mg/dL Final   Performed at [place] [address] [place], [place] [address]   Hepatitis B Surface Ag 10/03/2022 NON REACTIVE  NON REACTIVE Final   HCV Ab 10/03/2022 Reactive (A)  NON REACTIVE Final   Comment: (NOTE) The CDC recommends that a Reactive HCV antibody result be followed up  with a HCV Nucleic Acid Amplification test.     Hep A IgM 10/03/2022 NON REACTIVE  NON REACTIVE Final   Hep B C IgM 10/03/2022 NON REACTIVE  NON REACTIVE Final   Performed at [facility], [place] [address], [place], [place] [address]   Prothrombin Time 10/03/2022 11.7  11.4 - 15.2 seconds Final   INR 10/03/2022 0.9  0.8 - 1.2 Final   Comment: (NOTE) INR  goal varies based on device and disease states. Performed at [facility], [place] [address], [place], [place] [address]   Admission on 09/19/2022, Discharged on 09/20/2022  Component Date Value Ref Range Status   WBC 09/19/2022 6.3  4.0 - 10.5 K/uL Final   RBC 09/19/2022 4.15 (L)  4.22 - 5.81 MIL/uL Final   Hemoglobin 09/19/2022 14.6  13.0 - 17.0 g/dL Final   HCT 09/19/2022 41.7  39.0 - 52.0 % Final   MCV 09/19/2022 100.5 (H)  80.0 - 100.0 fL Final   MCH 09/19/2022 35.2 (H)  26.0 - 34.0 pg Final   MCHC 09/19/2022 35.0  30.0 - 36.0 g/dL Final   RDW 09/19/2022 14.9  11.5 - 15.5 % Final   Platelets 09/19/2022 96 (L)  150 - 400 K/uL Final   nRBC 09/19/2022 0.0  0.0 - 0.2 % Final   Neutrophils Relative % 09/19/2022 55  % Final   Neutro Abs 09/19/2022 3.5  1.7 - 7.7 K/uL Final   Lymphocytes Relative 09/19/2022 31  % Final   Lymphs Abs 09/19/2022 1.9  0.7 - 4.0 K/uL Final   Monocytes Relative 09/19/2022 11  % Final   Monocytes Absolute 09/19/2022 0.7  0.1 - 1.0 K/uL Final   Eosinophils Relative 09/19/2022 1  % Final   Eosinophils Absolute 09/19/2022 0.1  0.0 - 0.5 K/uL Final   Basophils Relative 09/19/2022 1  % Final   Basophils Absolute 09/19/2022 0.1  0.0 - 0.1 K/uL Final   Immature Granulocytes 09/19/2022 1  % Final   Abs Immature Granulocytes 09/19/2022 0.03  0.00 - 0.07 K/uL Final   Performed at [facility], [place], [place] [address]   Color, Urine 09/19/2022 STRAW (A)  YELLOW Final   APPearance 09/19/2022 CLEAR (A)  CLEAR Final   Specific Gravity, Urine 09/19/2022 1.004 (L)  1.005 - 1.030 Final   pH 09/19/2022 5.0  5.0 - 8.0 Final   Glucose, UA 09/19/2022 NEGATIVE  NEGATIVE mg/dL Final   Hgb urine dipstick 09/19/2022 NEGATIVE  NEGATIVE Final   Bilirubin Urine 09/19/2022 NEGATIVE  NEGATIVE Final   Ketones, ur 09/19/2022 NEGATIVE  NEGATIVE mg/dL Final   Protein, ur 09/19/2022 NEGATIVE  NEGATIVE mg/dL Final   Nitrite 09/19/2022 NEGATIVE  NEGATIVE  Final   Leukocytes,Ua 09/19/2022 NEGATIVE  NEGATIVE Final   Performed at [facility], [address] [place], [place] [id number]   Tricyclic, Ur Screen [id number] NONE DETECTED  NONE DETECTED Final   Amphetamines, Ur Screen 09/19/2022 NONE DETECTED  NONE DETECTED Final   MDMA (Ecstasy)Ur Screen 09/19/2022 NONE  DETECTED  NONE DETECTED Final   Cocaine Metabolite,Ur [place] 09/19/2022 NONE DETECTED  NONE DETECTED Final   Opiate, Ur Screen 09/19/2022 NONE DETECTED  NONE DETECTED Final   Phencyclidine (PCP) Ur S 09/19/2022 NONE DETECTED  NONE DETECTED Final   Cannabinoid 50 Ng, Ur [place] 09/19/2022 NONE DETECTED  NONE DETECTED Final   Barbiturates, Ur Screen 09/19/2022 NONE DETECTED  NONE DETECTED Final   Benzodiazepine, Ur Scrn 09/19/2022 NONE DETECTED  NONE DETECTED Final   Methadone Scn, Ur 09/19/2022 NONE DETECTED  NONE DETECTED Final   Comment: (NOTE) Tricyclics + metabolites, urine    Cutoff 1000 ng/mL Amphetamines + metabolites, urine  Cutoff 1000 ng/mL MDMA (Ecstasy), urine              Cutoff 500 ng/mL Cocaine Metabolite, urine          Cutoff 300 ng/mL Opiate + metabolites, urine        Cutoff 300 ng/mL Phencyclidine (PCP), urine         Cutoff 25 ng/mL Cannabinoid, urine                 Cutoff 50 ng/mL Barbiturates + metabolites, urine  Cutoff 200 ng/mL Benzodiazepine, urine              Cutoff 200 ng/mL Methadone, urine                   Cutoff 300 ng/mL  The urine drug screen provides only a preliminary, unconfirmed analytical test result and should not be used for non-medical purposes. Clinical consideration and professional judgment should be applied to any positive drug screen result due to possible interfering substances. A more specific alternate chemical method must be used in order to obtain a confirmed analytical result. Gas chromatography / mass spectrometry (GC/MS) is the preferred confirm                          atory method. Performed at [facility],  [place], [place] [address]    Troponin I (High Sensitivity) 09/19/2022 6  <18 ng/L Final   Comment: (NOTE) Elevated high sensitivity troponin I (hsTnI) values and significant  changes across serial measurements may suggest ACS but many other  chronic and acute conditions are known to elevate hsTnI results.  Refer to the Links section for chest pain algorithms and additional  guidance. Performed at [facility], [address] [place], [place] [address]    SARS Coronavirus 2 by RT PCR 09/19/2022 NEGATIVE  NEGATIVE Final   Comment: (NOTE) SARS-CoV-2 target nucleic acids are NOT DETECTED.  The SARS-CoV-2 RNA is generally detectable in upper respiratory specimens during the acute phase of infection. The lowest concentration of SARS-CoV-2 viral copies this assay can detect is 138 copies/mL. A negative result does not preclude SARS-Cov-2 infection and should not be used as the sole basis for treatment or other patient management decisions. A negative result may occur with  improper specimen collection/handling, submission of specimen other than nasopharyngeal swab, presence of viral mutation(s) within the areas targeted by this assay, and inadequate number of viral copies(<138 copies/mL). A negative result must be combined with clinical observations, patient history, and epidemiological information. The expected result is Negative.  Fact Sheet for Patients:  EntrepreneurPulse.com.au  Fact Sheet for Healthcare Providers:  IncredibleEmployment.be  This test is no  t yet approved or cleared by the [place] and  has been authorized for detection and/or diagnosis of SARS-CoV-2 by FDA under an Emergency Use Authorization (EUA). This EUA will remain  in effect (meaning this test can be used) for the duration of the COVID-19 declaration under Section 564(b)(1) of the Act, 21 U.S.C.section  360bbb-3(b)(1), unless the authorization is terminated  or revoked sooner.       Influenza A by PCR 09/19/2022 NEGATIVE  NEGATIVE Final   Influenza B by PCR 09/19/2022 NEGATIVE  NEGATIVE Final   Comment: (NOTE) The Xpert Xpress SARS-CoV-2/FLU/RSV plus assay is intended as an aid in the diagnosis of influenza from Nasopharyngeal swab specimens and should not be used as a sole basis for treatment. Nasal washings and aspirates are unacceptable for Xpert Xpress SARS-CoV-2/FLU/RSV testing.  Fact Sheet for Patients: EntrepreneurPulse.com.au  Fact Sheet for Healthcare Providers: IncredibleEmployment.be  This test is not yet approved or cleared by the [place] FDA and has been authorized for detection and/or diagnosis of SARS-CoV-2 by FDA under an Emergency Use Authorization (EUA). This EUA will remain in effect (meaning this test can be used) for the duration of the COVID-19 declaration under Section 564(b)(1) of the Act, 21 U.S.C. section 360bbb-3(b)(1), unless the authorization is terminated or revoked.     Resp Syncytial Virus by PCR 09/19/2022 NEGATIVE  NEGATIVE Final   Comment: (NOTE) Fact Sheet for Patients: EntrepreneurPulse.com.au  Fact Sheet for Healthcare Providers: IncredibleEmployment.be  This test is not yet approved or cleared by the [place] FDA and has been authorized for detection and/or diagnosis of SARS-CoV-2 by FDA under an Emergency Use Authorization (EUA). This EUA will remain in effect (meaning this test can be used) for the duration of the COVID-19 declaration under Section 564(b)(1) of the Act, 21 U.S.C. section 360bbb-3(b)(1), unless the authorization is terminated or revoked.  Performed at [facility], [address] [place], [place] [address]    Group A Strep by PCR 09/19/2022 NOT DETECTED  NOT DETECTED Final   Performed at [facility], [place], [place] [address]   Sodium 09/19/2022 143  135 - 145 mmol/L Final   Potassium 09/19/2022 3.9  3.5 - 5.1 mmol/L Final   Chloride 09/19/2022 106  98 - 111 mmol/L Final   CO2 09/19/2022 25  22 - 32 mmol/L Final   Glucose, Bld 09/19/2022 83  70 - 99 mg/dL Final   Glucose reference range applies only to samples taken after fasting for at least 8 hours.   BUN 09/19/2022 8  6 - 20 mg/dL Final   Creatinine, Ser 09/19/2022 0.92  0.61 - 1.24 mg/dL Final   Calcium 09/19/2022 7.9 (L)  8.9 - 10.3 mg/dL Final   Total Protein 09/19/2022 7.0  6.5 - 8.1 g/dL Final   Albumin 09/19/2022 3.8  3.5 - 5.0 g/dL Final   AST 09/19/2022 97 (H)  15 - 41 U/L Final   ALT 09/19/2022 48 (H)  0 - 44 U/L Final   Alkaline Phosphatase 09/19/2022 60  38 - 126 U/L Final   Total Bilirubin 09/19/2022 0.9  0.3 - 1.2 mg/dL Final   GFR, Estimated 09/19/2022 >60  >60 mL/min Final   Comment: (NOTE) Calculated using the CKD-EPI Creatinine Equation (2021)    Anion gap 09/19/2022 12  5 - 15 Final   Performed at [facility], [place], [place] [address]   Lipase 09/19/2022 51  11 - 51 U/L Final  Performed at [facility], [place], [place] [address]   Troponin I (High Sensitivity) 09/19/2022 8  <18 ng/L Final   Comment: (NOTE) Elevated high sensitivity troponin I (hsTnI) values and significant  changes across serial measurements may suggest ACS but many other  chronic and acute conditions are known to elevate hsTnI results.  Refer to the "Links" section for chest pain algorithms and additional  guidance. Performed at [facility], [address] [place], [place] [address]   Admission on 09/15/2022, Discharged on 09/15/2022  Component Date Value Ref Range Status   WBC 09/15/2022 4.9  4.0 - 10.5 K/uL Final   RBC 09/15/2022 4.09 (L)  4.22 - 5.81 MIL/uL Final   Hemoglobin 09/15/2022 14.5  13.0 - 17.0 g/dL Final   HCT 09/15/2022 39.9  39.0 - 52.0 %  Final   MCV 09/15/2022 97.6  80.0 - 100.0 fL Final   MCH 09/15/2022 35.5 (H)  26.0 - 34.0 pg Final   MCHC 09/15/2022 36.3 (H)  30.0 - 36.0 g/dL Final   RDW 09/15/2022 13.9  11.5 - 15.5 % Final   Platelets 09/15/2022 102 (L)  150 - 400 K/uL Final   nRBC 09/15/2022 0.0  0.0 - 0.2 % Final   Neutrophils Relative % 09/15/2022 37  % Final   Neutro Abs 09/15/2022 1.8  1.7 - 7.7 K/uL Final   Lymphocytes Relative 09/15/2022 49  % Final   Lymphs Abs 09/15/2022 2.4  0.7 - 4.0 K/uL Final   Monocytes Relative 09/15/2022 13  % Final   Monocytes Absolute 09/15/2022 0.6  0.1 - 1.0 K/uL Final   Eosinophils Relative 09/15/2022 0  % Final   Eosinophils Absolute 09/15/2022 0.0  0.0 - 0.5 K/uL Final   Basophils Relative 09/15/2022 1  % Final   Basophils Absolute 09/15/2022 0.1  0.0 - 0.1 K/uL Final   Immature Granulocytes 09/15/2022 0  % Final   Abs Immature Granulocytes 09/15/2022 0.00  0.00 - 0.07 K/uL Final   Performed at [facility], [place], [place] [address]   Sodium 09/15/2022 136  135 - 145 mmol/L Final   Potassium 09/15/2022 3.7  3.5 - 5.1 mmol/L Final   Chloride 09/15/2022 99  98 - 111 mmol/L Final   CO2 09/15/2022 23  22 - 32 mmol/L Final   Glucose, Bld 09/15/2022 107 (H)  70 - 99 mg/dL Final   Glucose reference range applies only to samples taken after fasting for at least 8 hours.   BUN 09/15/2022 7  6 - 20 mg/dL Final   Creatinine, Ser 09/15/2022 0.83  0.61 - 1.24 mg/dL Final   Calcium 09/15/2022 8.5 (L)  8.9 - 10.3 mg/dL Final   Total Protein 09/15/2022 7.7  6.5 - 8.1 g/dL Final   Albumin 09/15/2022 4.1  3.5 - 5.0 g/dL Final   AST 09/15/2022 89 (H)  15 - 41 U/L Final   ALT 09/15/2022 54 (H)  0 - 44 U/L Final   Alkaline Phosphatase 09/15/2022 67  38 - 126 U/L Final   Total Bilirubin 09/15/2022 1.2  0.3 - 1.2 mg/dL Final   GFR, Estimated 09/15/2022 >60  >60 mL/min Final   Comment: (NOTE) Calculated using the CKD-EPI Creatinine Equation (2021)    Anion gap  09/15/2022 14  5 - 15 Final   Performed at [facility], [place], [place] [address]   Alcohol, Ethyl (B) 09/15/2022 313 (HH)  <10 mg/dL Final   Comment:  CRITICAL RESULT CALLED TO, READ BACK BY AND VERIFIED WITH AMBER PAYNE AT 1722 ON 09/15/22 BY SS (NOTE) Lowest detectable limit for serum alcohol is 10 mg/dL.  For medical purposes only. Performed at [facility], [place], [place] [address]    Color, Urine 09/15/2022 YELLOW (A)  YELLOW Final   APPearance 09/15/2022 CLEAR (A)  CLEAR Final   Specific Gravity, Urine 09/15/2022 1.005  1.005 - 1.030 Final   pH 09/15/2022 6.0  5.0 - 8.0 Final   Glucose, UA 09/15/2022 NEGATIVE  NEGATIVE mg/dL Final   Hgb urine dipstick 09/15/2022 NEGATIVE  NEGATIVE Final   Bilirubin Urine 09/15/2022 NEGATIVE  NEGATIVE Final   Ketones, ur 09/15/2022 NEGATIVE  NEGATIVE mg/dL Final   Protein, ur 09/15/2022 NEGATIVE  NEGATIVE mg/dL Final   Nitrite 09/15/2022 NEGATIVE  NEGATIVE Final   Leukocytes,Ua 09/15/2022 NEGATIVE  NEGATIVE Final   Performed at [facility], [place] [place], [place] [id number]   Tricyclic, Ur Screen 0000000 NONE DETECTED  NONE DETECTED Final   Amphetamines, Ur Screen 09/15/2022 NONE DETECTED  NONE DETECTED Final   MDMA (Ecstasy)Ur Screen 09/15/2022 NONE DETECTED  NONE DETECTED Final   Cocaine Metabolite,Ur [place] 09/15/2022 NONE DETECTED  NONE DETECTED Final   Opiate, Ur Screen 09/15/2022 NONE DETECTED  NONE DETECTED Final   Phencyclidine (PCP) Ur S 09/15/2022 NONE DETECTED  NONE DETECTED Final   Cannabinoid 50 Ng, Ur [place] 09/15/2022 NONE DETECTED  NONE DETECTED Final   Barbiturates, Ur Screen 09/15/2022 NONE DETECTED  NONE DETECTED Final   Benzodiazepine, Ur Scrn 09/15/2022 NONE DETECTED  NONE DETECTED Final   Methadone Scn, Ur 09/15/2022 NONE DETECTED  NONE DETECTED Final   Comment: (NOTE) Tricyclics + metabolites, urine    Cutoff 1000 ng/mL Amphetamines + metabolites,  urine  Cutoff 1000 ng/mL MDMA (Ecstasy), urine              Cutoff 500 ng/mL Cocaine Metabolite, urine          Cutoff 300 ng/mL Opiate + metabolites, urine        Cutoff 300 ng/mL Phencyclidine (PCP), urine         Cutoff 25 ng/mL Cannabinoid, urine                 Cutoff 50 ng/mL Barbiturates + metabolites, urine  Cutoff 200 ng/mL Benzodiazepine, urine              Cutoff 200 ng/mL Methadone, urine                   Cutoff 300 ng/mL  The urine drug screen provides only a preliminary, unconfirmed analytical test result and should not be used for non-medical purposes. Clinical consideration and professional judgment should be applied to any positive drug screen result due to possible interfering substances. A more specific alternate chemical method must be used in order to obtain a confirmed analytical result. Gas chromatography / mass spectrometry (GC/MS) is the preferred confirm                          atory method. Performed at [facility], [address] [place], [place] [address]     Blood Alcohol level:  Lab Results  Component Value Date   ETH 214 (H) 09/30/2022   ETH 313 (HH) 0000000    Metabolic Disorder Labs: Lab Results  Component Value Date   HGBA1C 5.4 09/30/2022   MPG 108 09/30/2022  No results found for: "PROLACTIN" Lab Results  Component Value Date   CHOL 224 (H) 09/30/2022   TRIG 67 09/30/2022   HDL >135 09/30/2022   CHOLHDL NOT CALCULATED 09/30/2022   VLDL 13 09/30/2022   LDLCALC NOT CALCULATED 09/30/2022    Therapeutic Lab Levels: No results found for: "LITHIUM" No results found for: "VALPROATE" No results found for: "CBMZ"  Physical Findings   PHQ2-9    Flowsheet Row ED from 09/30/2022 in [facility]  PHQ-2 Total Score 3  PHQ-9 Total Score 16      [place] ED from 09/30/2022 in [facility] ED from 09/19/2022 in [facility] Emergency Department at [facility] ED from 09/15/2022 in [facility] Emergency Department at [place] No Risk Low Risk No Risk        Musculoskeletal  Strength & Muscle Tone: within normal limits Gait & Station: normal Patient leans: N/A  Psychiatric Specialty Exam  Presentation  General Appearance:  Appropriate for Environment; Casual  Eye Contact: Good  Speech: Clear and Coherent; Normal Rate  Speech Volume: Normal  Handedness: Right   Mood and Affect  Mood: Anxious  Affect: Congruent   Thought Process  Thought Processes: Coherent  Descriptions of Associations:Intact  Orientation:Full (Time, Place and Person)  Thought Content:Logical  Diagnosis of Schizophrenia or Schizoaffective disorder in past: No    Hallucinations:Hallucinations: None  Ideas of Reference:None  Suicidal Thoughts:Suicidal Thoughts: No  Homicidal Thoughts:Homicidal Thoughts: No   Sensorium  Memory: Immediate Good; Recent Good; Remote Good  Judgment: Fair  Insight: Fair   Community education officer  Concentration: Good  Attention Span: Fair  Recall: Good  Fund of Knowledge: Good  Language: Good   Psychomotor Activity  Psychomotor Activity: Psychomotor Activity: Normal   Assets  Assets: Communication Skills; Desire for Improvement; Leisure Time; Physical Health; Resilience   Sleep  Sleep: Sleep: Good   No data recorded  Physical Exam  Physical Exam Vitals and nursing note reviewed.  Constitutional:      General: He is not in acute distress.    Appearance: Normal appearance. He is well-developed.  HENT:     Head: Normocephalic and atraumatic.  Eyes:     General:        Right eye: No discharge.        Left eye: No discharge.  Cardiovascular:     Rate and Rhythm: Normal rate.  Pulmonary:     Effort: Pulmonary effort is normal. No respiratory distress.  Musculoskeletal:        General: No swelling. Normal range of motion.     Cervical  back: Normal range of motion.  Skin:    Coloration: Skin is not jaundiced or pale.  Neurological:     Mental Status: He is alert and oriented to person, place, and time.  Psychiatric:        Attention and Perception: Attention and perception normal.        Mood and Affect: Mood is anxious.        Speech: Speech normal.        Behavior: Behavior normal.        Thought Content: Thought content normal.        Cognition and Memory: Cognition normal.        Judgment: Judgment normal.    Review of Systems  Constitutional: Negative.   HENT: Negative.    Eyes: Negative.   Respiratory: Negative.    Cardiovascular:  Negative.   Musculoskeletal: Negative.   Skin: Negative.   Neurological: Negative.   Psychiatric/Behavioral:  The patient is nervous/anxious.    Blood pressure 114/69, pulse 84, temperature 98.6 F (37 C), temperature source Oral, resp. rate 18, SpO2 100 %. There is no height or weight on file to calculate BMI.  Treatment Plan Summary:  Disposition: Plan is to discharge on 10/07/2022.  Social work notified that patient will need transportation to his home in [place].  Per SW note, "LCSW contacted [facility] in [place] regarding patient's interest in [place]. Per Admissions Coordinator Asha, patient would need to present to the agency during open access hours Monday, Wednesday, Friday from 8:00am-3:00pm. Unable to arrange an appt at this time. Patient encouraged to report at his earliest convenience to inform agency of his interest in [place]".   Will continue to have Daily contact with patient to assess and evaluate symptoms and progress in treatment and Medication management  Revonda Humphrey, NP 10/06/2022 2:15 PM

## 2022-10-07 DIAGNOSIS — R200 Anesthesia of skin: Secondary | ICD-10-CM | POA: Diagnosis not present

## 2022-10-07 DIAGNOSIS — R5383 Other fatigue: Secondary | ICD-10-CM | POA: Diagnosis not present

## 2022-10-07 DIAGNOSIS — F10229 Alcohol dependence with intoxication, unspecified: Secondary | ICD-10-CM | POA: Diagnosis not present

## 2022-10-07 DIAGNOSIS — Z1152 Encounter for screening for COVID-19: Secondary | ICD-10-CM | POA: Diagnosis not present

## 2022-10-07 MED ORDER — NICOTINE 21 MG/24HR TD PT24: 21.0000 mg | MEDICATED_PATCH | Freq: Every day | TRANSDERMAL | 0 refills | Status: AC

## 2022-10-07 NOTE — ED Notes (Signed)
Patient A&O x 4, ambulatory. Patient discharged in no acute distress. Patient denied SI/HI, A/VH upon discharge. Patient verbalized understanding of all discharge instructions explained by staff, to include follow up appointments and safety plan. Patient reported mood 10/10.  Pt belongings returned to patient from locker #17 intact. Patient escorted to lobby via staff with buss pass provided by facility. Safety maintained.

## 2022-10-07 NOTE — Group Note (Signed)
Group Topic: Understanding Self  Group Date: 10/07/2022 Start Time: 1000 End Time: [place] Facilitators: Quentin Cornwall B  Department: [facility]  Number of Participants: 6  Group Focus: acceptance and self-awareness Treatment Modality:  Psychoeducation Interventions utilized were group exercise Purpose: regain self-worth  Name: Ryan Petty Date of Birth: 13-Jan-1966  MR: [id number]    Level of Participation: moderate Quality of Participation: attentive and cooperative Interactions with others: gave feedback Mood/Affect: positive Triggers (if applicable): na Cognition: coherent/clear Progress: Moderate Response: na Plan: follow-up needed  Patients Problems:  Patient Active Problem List   Diagnosis Date Noted   ETOH abuse 09/20/2022   Head injury 09/20/2022   MDD (major depressive disorder), recurrent episode, moderate ([place]) 09/19/2022   Alcohol abuse with intoxication ([place]) 09/19/2022   Supraglottitis 11/12/2021   Laryngitis, acute 11/12/2021   Nicotine dependence 11/12/2021   Hepatitis C without hepatic coma 01/13/2019

## 2022-10-07 NOTE — Discharge Planning (Signed)
LCSW spoke with patient on this morning who reports he will be discharging back home on today via [employer] and will follow up with [facility] for [facility] services. Patient aware that open access hours have been added to his discharge paperwork as well as PCP providers that he can follow up with. Patient was appreciative of LCSW assistance and attempt to locate placement for him. Patient was provided a [employer] ticket in order to get back to his home in [place]. No other needs were reported at this time.   LCSW to sign off. Please inform if further LCSW needs arise prior to discharge.   Lucius Conn, LCSW Clinical Social Worker [place] Ph: [phone]

## 2022-10-07 NOTE — ED Notes (Signed)
Patient A&Ox4. Denies intent to harm self/others when asked. Denies A/VH. Patient denies any physical complaints when asked. No acute distress noted. Routine safety checks conducted according to facility protocol. Encouraged patient to notify staff if thoughts of harm toward self or others arise. Patient verbalize understanding and agreement. Will continue to monitor for safety.    

## 2022-10-07 NOTE — ED Notes (Signed)
Pt is in the bed sleeping. Respirations are even and unlabored. No acute distress noted. Will continue to monitor for safety. 

## 2022-10-07 NOTE — ED Provider Notes (Signed)
FBC/OBS ASAP Discharge Summary  Date and Time: 10/07/2022 1:11 PM  Name: Ryan Petty  MRN:  [id number]   Discharge Diagnoses:  Final diagnoses:  Alcohol abuse with intoxication ([place])    Subjective:  The patient is a 57 year old male with little past psychiatric history and no behavioral health admissions.  He presented requesting help with alcohol use.  He was admitted to the facility based crisis.  Stay Summary:  The patient appeared to be exhibiting significant withdrawal symptoms on admission.  He did well with an Ativan taper and gabapentin.  We were unable to find residential rehab placement for the patient, primarily because he is a resident of [facility] and has Medicaid.  He did participate well in groups while admitted and spent 1 week here.  On discharge he was motivated to quit using alcohol and information was provided for the patient to walk-in at [facility] in [place].  We were unable to make an appointment with this agency per their policy.  The patient reports that he will come back and seek treatment should his substance use symptoms worsen.  The patient has liver dysfunction, as evidenced by elevated T. bili, low platelets, and chronically elevated LFTs.  Provided him with information to follow-up with a primary care doctor.  The patient expresses understanding and agreed to follow-up.  The patient denies auditory/visual hallucinations.  The patient reports good mood, appetite, and sleep. They deny suicidal and homicidal thoughts. The patient denies side effects from their medications.  Review of systems as below. The patient denies experiencing any withdrawal symptoms.    Total Time spent with patient: 20 minutes  Past Psychiatric History: as above Past Medical History: as above Family History: none Family Psychiatric History: none Social History: as above Tobacco Cessation:  A prescription for an FDA-approved tobacco cessation medication provided at  discharge   Current Medications:  Current Facility-Administered Medications  Medication Dose Route Frequency Provider Last Rate Last Admin   acetaminophen (TYLENOL) tablet 650 mg  650 mg Oral Q6H PRN Tharon Aquas, NP       alum & mag hydroxide-simeth (MAALOX/MYLANTA) 200-200-20 MG/5ML suspension 30 mL  30 mL Oral Q4H PRN Tharon Aquas, NP       gabapentin (NEURONTIN) capsule 200 mg  200 mg Oral BID Corky Sox, MD   200 mg at 10/07/22 0913   hydrOXYzine (ATARAX) tablet 25 mg  25 mg Oral TID PRN [place], Ene A, NP   25 mg at 10/06/22 2104   magnesium hydroxide (MILK OF MAGNESIA) suspension 30 mL  30 mL Oral Daily PRN Tharon Aquas, NP       multivitamin with minerals tablet 1 tablet  1 tablet Oral Daily Corky Sox, MD   1 tablet at 10/07/22 0913   nicotine (NICODERM CQ - dosed in mg/24 hours) patch 21 mg  21 mg Transdermal Daily Tharon Aquas, NP   21 mg at 10/07/22 0913   thiamine (VITAMIN B1) tablet 100 mg  100 mg Oral Daily Corky Sox, MD   100 mg at 10/07/22 0913   traZODone (DESYREL) tablet 50 mg  50 mg Oral QHS PRN Corky Sox, MD   50 mg at 10/06/22 2104   Current Outpatient Medications  Medication Sig Dispense Refill   [START ON 10/08/2022] nicotine (NICODERM CQ - DOSED IN MG/24 HOURS) 21 mg/24hr patch Place 1 patch (21 mg total) onto the skin daily. 28 patch 0    PTA Medications:  PTA Medications  Medication Sig   [  START ON 10/08/2022] nicotine (NICODERM CQ - DOSED IN MG/24 HOURS) 21 mg/24hr patch Place 1 patch (21 mg total) onto the skin daily.   Facility Ordered Medications  Medication   acetaminophen (TYLENOL) tablet 650 mg   alum & mag hydroxide-simeth (MAALOX/MYLANTA) 200-200-20 MG/5ML suspension 30 mL   magnesium hydroxide (MILK OF MAGNESIA) suspension 30 mL   [EXPIRED] hydrOXYzine (ATARAX) tablet 25 mg   [EXPIRED] loperamide (IMODIUM) capsule 2-4 mg   [EXPIRED] ondansetron (ZOFRAN-ODT) disintegrating tablet 4 mg   nicotine  (NICODERM CQ - dosed in mg/24 hours) patch 21 mg   thiamine (VITAMIN B1) tablet 100 mg   multivitamin with minerals tablet 1 tablet   [EXPIRED] LORazepam (ATIVAN) tablet 1 mg   [COMPLETED] LORazepam (ATIVAN) tablet 1 mg   Followed by   [COMPLETED] LORazepam (ATIVAN) tablet 1 mg   Followed by   [COMPLETED] LORazepam (ATIVAN) tablet 1 mg   Followed by   [COMPLETED] LORazepam (ATIVAN) tablet 1 mg   gabapentin (NEURONTIN) capsule 200 mg   traZODone (DESYREL) tablet 50 mg   hydrOXYzine (ATARAX) tablet 25 mg       10/07/2022    1:10 PM 10/05/2022   11:48 AM 10/04/2022   10:00 AM  Depression screen PHQ 2/9  Decreased Interest 0 2 1  Down, Depressed, Hopeless 0 1 1  PHQ - 2 Score 0 3 2  Altered sleeping 0 3 3  Tired, decreased energy 0 3 3  Change in appetite 0 3 3  Feeling bad or failure about yourself  0 1 2  Trouble concentrating 0 2 1  Moving slowly or fidgety/restless 0 0 1  Suicidal thoughts 0 1 1  PHQ-9 Score 0 16 16  Difficult doing work/chores Not difficult at all Somewhat difficult Somewhat difficult    Flowsheet Row ED from 09/30/2022 in [facility] ED from 09/19/2022 in [facility] Emergency Department at [facility] ED from 09/15/2022 in [facility] Emergency Department at [place] No Risk Low Risk No Risk      Musculoskeletal  Strength & Muscle Tone: within normal limits Gait & Station: normal Patient leans: N/A  Psychiatric Specialty Exam  Presentation General Appearance: Appropriate for Environment  Eye Contact:Fair  Speech:Clear and Coherent  Speech Volume:Normal  Handedness:-- (not assessed)   Mood and Affect  Mood:Euthymic  Affect:Congruent   Thought Process  Thought Processes:Coherent; Linear  Descriptions of Associations:Intact  Orientation:Full (Time, Place and Person)  Thought Content:Logical    Hallucinations:Hallucinations: None  Ideas of Reference:None  Suicidal  Thoughts:Suicidal Thoughts: No  Homicidal Thoughts:Homicidal Thoughts: No   Sensorium  Memory:Immediate Fair; Recent Fair; Remote Fair  Judgment:Fair  Insight:Fair   Executive Functions  Concentration:Fair  Attention Span:Fair  [place] of Knowledge:Fair  Language:Fair   Psychomotor Activity  Psychomotor Activity:Psychomotor Activity: Normal   Assets  Assets:Communication Skills; Resilience   Sleep  Sleep:Sleep: Fair    Physical Exam Constitutional:      Appearance: the patient is not toxic-appearing.  Pulmonary:     Effort: Pulmonary effort is normal.  Neurological:     General: No focal deficit present.     Mental Status: the patient is alert and oriented to person, place, and time.   Review of Systems  Respiratory:  Negative for shortness of breath.   Cardiovascular:  Negative for chest pain.  Gastrointestinal:  Negative for abdominal pain, constipation, diarrhea, nausea and vomiting.  Neurological:  Negative for headaches.    BP 106/75  Pulse 83   Temp 98.3 F (36.8 C)   Resp 18   SpO2 98%   Demographic Factors:  None pertinent   Loss Factors: NA   Historical Factors: NA   Risk Reduction Factors:   Positive social support, Positive therapeutic relationship, and Positive coping skills or problem solving skills   Continued Clinical Symptoms:  Substance use    Cognitive Features That Contribute To Risk:  None     Suicide Risk:  Mild   Plan Of Care/Follow-up recommendations:  Activity as tolerated. Diet as recommended by PCP. Keep all scheduled follow-up appointments as recommended.   Patient is instructed to take all prescribed medications as recommended. Report any side effects or adverse reactions to your outpatient psychiatrist. Patient is instructed to abstain from alcohol and illegal drugs while on prescription medications. In the event of worsening symptoms, patient is instructed to call the crisis hotline, 911,  or go to the nearest emergency department for evaluation and treatment.     Patient is also instructed prior to discharge to: Take all medications as prescribed by mental healthcare provider. Report any adverse effects and or reactions from the medicines to outpatient provider promptly. Patient has been instructed & cautioned: To not engage in alcohol and or illegal drug use while on prescription medicines. In the event of worsening symptoms,  patient is instructed to call the crisis hotline, 911 and or go to the nearest ED for appropriate evaluation and treatment of symptoms. To follow-up with primary care provider for other medical issues, concerns and or health care needs    Disposition: self-care     Corky Sox, MD 10/07/2022, 1:11 PM

## 2022-10-07 NOTE — Group Note (Unsigned)
Group Topic: Understanding Self  Group Date: 10/07/2022 Start Time: 1000 End Time: [name] Facilitators: Quentin Cornwall B  Department: [facility]  Number of Participants: 6  Group Focus: feeling awareness/expression Treatment Modality:  Psychoeducation Interventions utilized were group exercise Purpose: regain self-worth   Name: Ryan Petty Date of Birth: 07-30-1965  MR: [id number]    Level of Participation: {THERAPIES; PSYCH GROUP PARTICIPATION [id number] Quality of Participation: {THERAPIES; PSYCH QUALITY OF PARTICIPATION:23992} Interactions with others: {THERAPIES; PSYCH INTERACTIONS:23993} Mood/Affect: {THERAPIES; PSYCH MOOD/AFFECT:23994} Triggers (if applicable): *** Cognition: {THERAPIES; PSYCH COGNITION:23995} Progress: {THERAPIES; PSYCH PROGRESS:23997} Response: *** Plan: {THERAPIES; PSYCH [id number]  Patients Problems:  Patient Active Problem List   Diagnosis Date Noted   ETOH abuse 09/20/2022   Head injury 09/20/2022   MDD (major depressive disorder), recurrent episode, moderate ([place]) 09/19/2022   Alcohol abuse with intoxication ([place]) 09/19/2022   Supraglottitis 11/12/2021   Laryngitis, acute 11/12/2021   Nicotine dependence 11/12/2021   Hepatitis C without hepatic coma 01/13/2019

## 2022-10-07 NOTE — ED Notes (Signed)
Pt sleeping in no acute distress. RR even and unlabored. Environment secured. Will continue to monitor for safety. 

## 2023-10-18 IMAGING — DX DG CHEST 1V PORT
1 series · 2 of 2 positions shown · non-contrast
Comparison: 06/25/2020

CLINICAL DATA: Cough and shortness of breath

EXAM:
PORTABLE CHEST 1 VIEW

[Series 1: chest ap · 0.14mm/px · 2 of 2 slices shown]
[im 1/2]
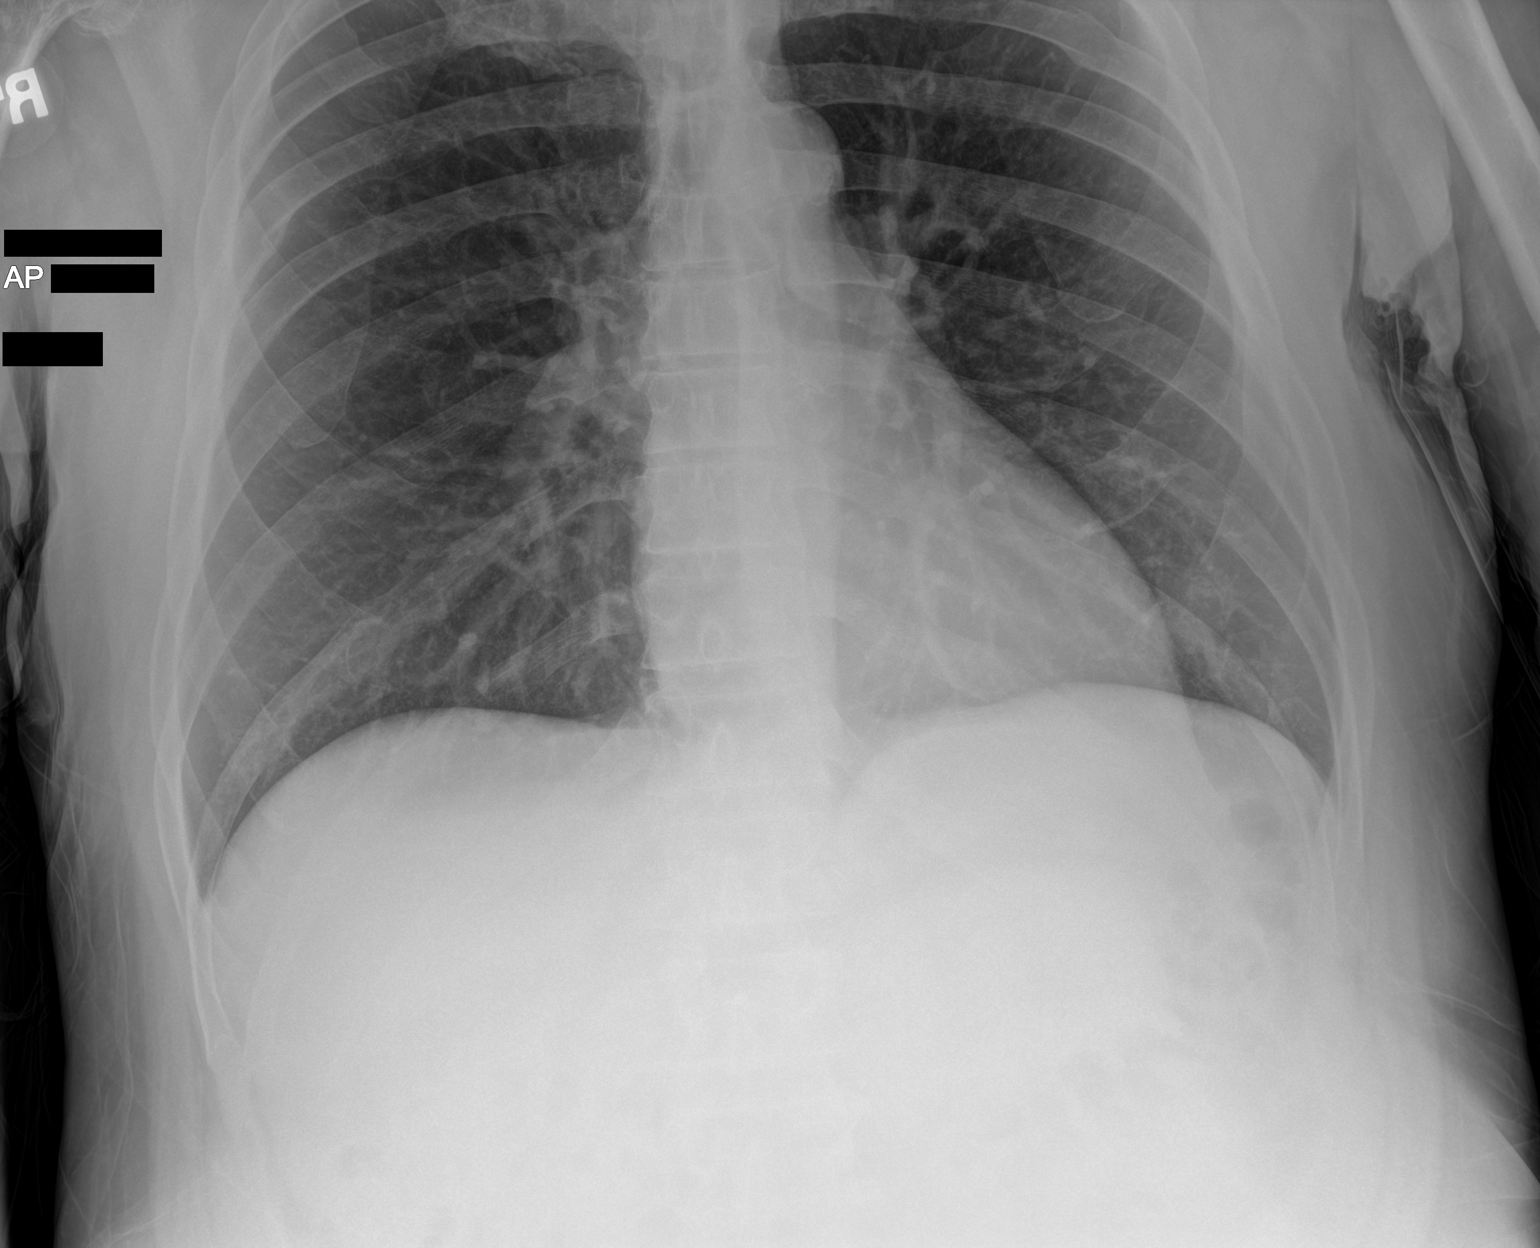
[im 2/2]
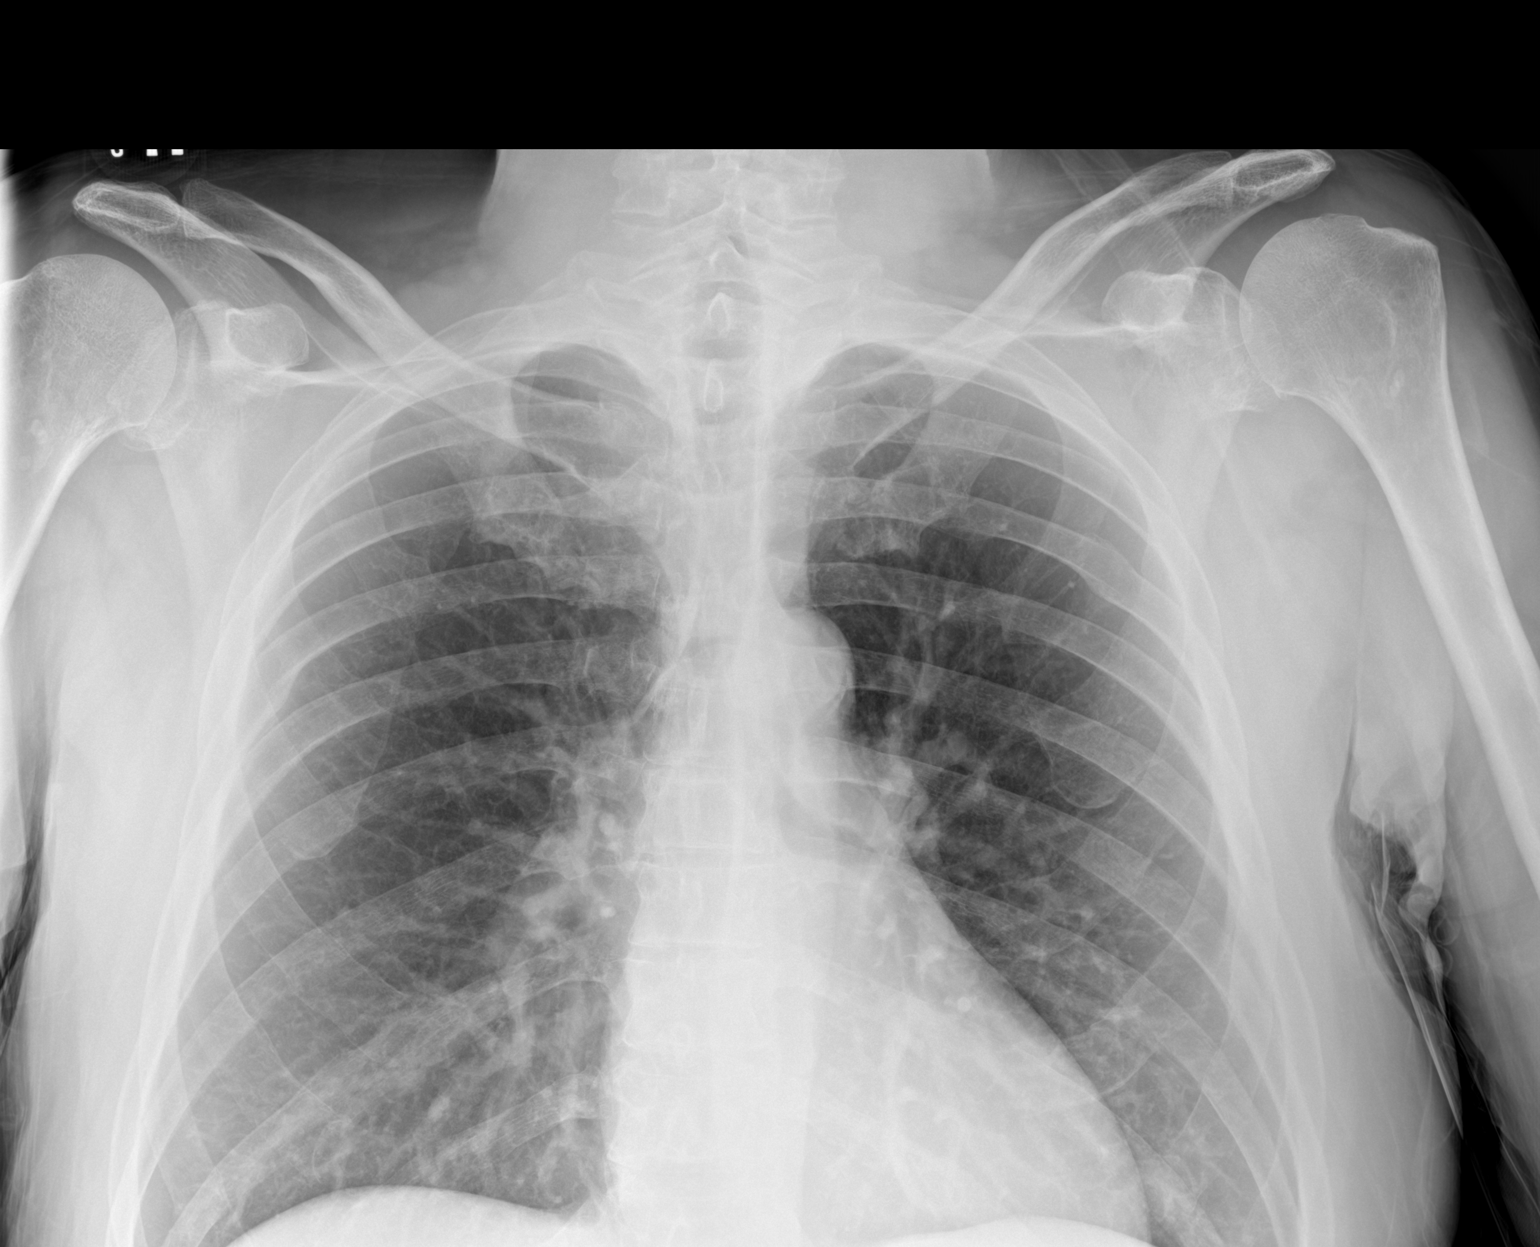

[2 of 2 positions shown; findings below may reference images not displayed]

FINDINGS: The heart size and mediastinal contours are within normal limits.
Both lungs are clear. The visualized skeletal structures are
unremarkable.
IMPRESSION: No active disease.

## 2024-06-12 ENCOUNTER — Emergency Department
Admission: EM | Admit: 2024-06-12 | Discharge: 2024-06-12 | Disposition: A | Discharge status: Home / Self Care | Payer: MEDICAID | Attending: Emergency Medicine | Admitting: Emergency Medicine

## 2024-06-12 ENCOUNTER — Other Ambulatory Visit: Payer: Self-pay

## 2024-06-12 ENCOUNTER — Emergency Department: Payer: MEDICAID

## 2024-06-12 DIAGNOSIS — S299XXA Unspecified injury of thorax, initial encounter: Secondary | ICD-10-CM | POA: Diagnosis present

## 2024-06-12 DIAGNOSIS — S2241XA Multiple fractures of ribs, right side, initial encounter for closed fracture: Secondary | ICD-10-CM | POA: Diagnosis not present

## 2024-06-12 DIAGNOSIS — W228XXA Striking against or struck by other objects, initial encounter: Secondary | ICD-10-CM | POA: Insufficient documentation

## 2024-06-12 LAB — COMPREHENSIVE METABOLIC PANEL WITH GFR
ALT: 33 U/L (ref 0–44)
AST: 52 U/L — ABNORMAL HIGH (ref 15–41)
Albumin: 4 g/dL (ref 3.5–5.0)
Alkaline Phosphatase: 64 U/L (ref 38–126)
Anion gap: 11 (ref 5–15)
BUN: 7 mg/dL (ref 6–20)
CO2: 23 mmol/L (ref 22–32)
Calcium: 8.3 mg/dL — ABNORMAL LOW (ref 8.9–10.3)
Chloride: 106 mmol/L (ref 98–111)
Creatinine, Ser: 0.86 mg/dL (ref 0.61–1.24)
GFR, Estimated: >60 mL/min (ref >60)
Glucose, Bld: 112 mg/dL — ABNORMAL HIGH (ref 70–99)
Potassium: 4.9 mmol/L (ref 3.5–5.1)
Sodium: 140 mmol/L (ref 135–145)
Total Bilirubin: 0.7 mg/dL (ref 0.0–1.2)
Total Protein: 6.8 g/dL (ref 6.5–8.1)

## 2024-06-12 LAB — PROTIME-INR
INR: 0.9 (ref 0.8–1.2)
Prothrombin Time: 12.8 s (ref 11.4–15.2)

## 2024-06-12 LAB — CBC WITH DIFFERENTIAL/PLATELET
Abs Immature Granulocytes: 0.07 K/uL (ref 0.00–0.07)
Basophils Absolute: 0 K/uL (ref 0.0–0.1)
Basophils Relative: 0 %
Eosinophils Absolute: 0 K/uL (ref 0.0–0.5)
Eosinophils Relative: 0 %
HCT: 37.7 % — ABNORMAL LOW (ref 39.0–52.0)
Hemoglobin: 13.7 g/dL (ref 13.0–17.0)
Immature Granulocytes: 1 %
Lymphocytes Relative: 12 %
Lymphs Abs: 1.1 K/uL (ref 0.7–4.0)
MCH: 36 pg — ABNORMAL HIGH (ref 26.0–34.0)
MCHC: 36.3 g/dL — ABNORMAL HIGH (ref 30.0–36.0)
MCV: 99 fL (ref 80.0–100.0)
Monocytes Absolute: 0.9 K/uL (ref 0.1–1.0)
Monocytes Relative: 10 %
Neutro Abs: 7.3 K/uL (ref 1.7–7.7)
Neutrophils Relative %: 77 %
Platelets: 227 K/uL (ref 150–400)
RBC: 3.81 MIL/uL — ABNORMAL LOW (ref 4.22–5.81)
RDW: 15.1 % (ref 11.5–15.5)
WBC: 9.4 K/uL (ref 4.0–10.5)
nRBC: 0 % (ref 0.0–0.2)

## 2024-06-12 MED ORDER — KETOROLAC TROMETHAMINE 15 MG/ML IJ SOLN
15.0000 mg | Freq: Once | INTRAMUSCULAR | Status: AC
  Administered 2024-06-12: 15 mg via INTRAVENOUS
  Filled 2024-06-12: qty 1

## 2024-06-12 MED ORDER — NAPROXEN 500 MG PO TABS: 500.0000 mg | ORAL_TABLET | Freq: Two times a day (BID) | ORAL | 0 refills | Status: AC

## 2024-06-12 MED ORDER — LIDOCAINE 5 % EX PTCH: 1.0000 | MEDICATED_PATCH | Freq: Two times a day (BID) | CUTANEOUS | 0 refills | Status: AC

## 2024-06-12 MED ORDER — LIDOCAINE 5 % EX PTCH
1.0000 | MEDICATED_PATCH | CUTANEOUS | Status: DC
  Administered 2024-06-12: 1 via TRANSDERMAL
  Filled 2024-06-12: qty 1

## 2024-06-12 MED ORDER — OXYCODONE HCL 5 MG PO TABS: 5.0000 mg | ORAL_TABLET | Freq: Three times a day (TID) | ORAL | 0 refills | Status: AC | PRN

## 2024-06-12 MED ORDER — IOHEXOL 300 MG/ML  SOLN
100.0000 mL | Freq: Once | INTRAMUSCULAR | Status: AC | PRN
  Administered 2024-06-12: 100 mL via INTRAVENOUS

## 2024-06-12 MED ORDER — MORPHINE SULFATE (PF) 4 MG/ML IV SOLN
4.0000 mg | Freq: Once | INTRAVENOUS | Status: AC
  Administered 2024-06-12: 4 mg via INTRAVENOUS
  Filled 2024-06-12: qty 1

## 2024-06-12 NOTE — Discharge Instructions (Addendum)
 You have 2 rib fractures.  Please use your incentive spirometer.  You may take the medications to help with your pain.  Please return for any new, worsening, or changing symptoms or other concerns.  It was a pleasure caring for you today.

## 2024-06-12 NOTE — ED Notes (Signed)
 See triage note  Presents with pain to lower back  States he was hit by a lawnmower battery yesterday Able to ambulate
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
